# Patient Record
Sex: Female | Born: 1960 | Race: White | Hispanic: No | Marital: Married | Smoking: Never smoker
Health system: Southern US, Community
[De-identification: ages and names within clinical notes are randomized; demographics above are authoritative.]

## PROBLEM LIST (undated history)

## (undated) DIAGNOSIS — T7840XA Allergy, unspecified, initial encounter: Secondary | ICD-10-CM

## (undated) DIAGNOSIS — E079 Disorder of thyroid, unspecified: Secondary | ICD-10-CM

## (undated) DIAGNOSIS — K219 Gastro-esophageal reflux disease without esophagitis: Secondary | ICD-10-CM

## (undated) DIAGNOSIS — M109 Gout, unspecified: Secondary | ICD-10-CM

## (undated) DIAGNOSIS — I10 Essential (primary) hypertension: Secondary | ICD-10-CM

## (undated) HISTORY — DX: Gastro-esophageal reflux disease without esophagitis: K21.9

## (undated) HISTORY — DX: Essential (primary) hypertension: I10

## (undated) HISTORY — DX: Gout, unspecified: M10.9

## (undated) HISTORY — DX: Disorder of thyroid, unspecified: E07.9

## (undated) HISTORY — DX: Allergy, unspecified, initial encounter: T78.40XA

## (undated) HISTORY — PX: TONSILLECTOMY AND ADENOIDECTOMY: SHX28

---

## 2010-11-14 ENCOUNTER — Ambulatory Visit: Payer: Self-pay

## 2011-11-05 LAB — HM PAP SMEAR

## 2011-11-20 ENCOUNTER — Ambulatory Visit: Payer: Self-pay

## 2012-11-25 ENCOUNTER — Ambulatory Visit: Payer: Self-pay

## 2012-11-25 LAB — HM MAMMOGRAPHY

## 2012-12-01 ENCOUNTER — Ambulatory Visit: Payer: Self-pay

## 2013-01-12 ENCOUNTER — Ambulatory Visit: Payer: Self-pay | Admitting: Gastroenterology

## 2014-05-24 ENCOUNTER — Ambulatory Visit: Payer: Self-pay | Admitting: Surgery

## 2014-05-24 LAB — HEPATIC FUNCTION PANEL A (ARMC)
Albumin: 3.7 g/dL (ref 3.4–5.0)
Alkaline Phosphatase: 243 U/L — ABNORMAL HIGH
BILIRUBIN TOTAL: 1 mg/dL (ref 0.2–1.0)
Bilirubin, Direct: 0.2 mg/dL (ref 0.0–0.2)
SGOT(AST): 42 U/L — ABNORMAL HIGH (ref 15–37)
SGPT (ALT): 60 U/L
TOTAL PROTEIN: 8 g/dL (ref 6.4–8.2)

## 2014-05-24 LAB — POTASSIUM: POTASSIUM: 3.9 mmol/L (ref 3.5–5.1)

## 2014-05-24 LAB — HEMOGLOBIN: HGB: 14.1 g/dL (ref 12.0–16.0)

## 2014-05-25 ENCOUNTER — Ambulatory Visit: Payer: Self-pay | Admitting: Surgery

## 2014-05-25 HISTORY — PX: CHOLECYSTECTOMY: SHX55

## 2014-05-25 LAB — CREATININE, SERUM
Creatinine: 1.03 mg/dL (ref 0.60–1.30)
EGFR (African American): >60
EGFR (Non-African Amer.): 60 — ABNORMAL LOW

## 2014-09-25 ENCOUNTER — Ambulatory Visit
Admit: 2014-09-25 | Disposition: A | Discharge status: Home / Self Care | Payer: Self-pay | Attending: Gastroenterology | Admitting: Gastroenterology

## 2014-09-28 LAB — HM COLONOSCOPY

## 2014-09-30 NOTE — Op Note (Signed)
PATIENT NAME:  Kristen CallanderBROWN, Bracha D MR#:  [id number] DATE OF BIRTH:  January 24, 1961  DATE OF PROCEDURE:  05/25/2014  PREOPERATIVE DIAGNOSIS:  Acute cholecystitis.   POSTOPERATIVE DIAGNOSIS:  Acute cholecystitis.   OPERATION:  Laparoscopic cholecystectomy with cholangiography.   SURGEON:  Quentin Orealph L. Ely III, MD   ANESTHESIA:  General.   OPERATIVE PROCEDURE:  With the patient in supine position after receiving appropriate general anesthesia, the patient's abdomen was prepped with ChloraPrep and draped with sterile towels. The patient was placed in the head-down, feet-up position. A small infraumbilical incision was made in standard fashion and carried down bluntly through subcutaneous tissue. A Veress needle was used to cannulate the peritoneal cavity. CO2 was insufflated to appropriate pressure measurements. When approximately 2.5 liters of CO2 were instilled, the Veress needle was withdrawn. An 11 mm [employer] port was inserted into the peritoneal cavity. Intraperitoneal position was confirmed. CO2 was reinsufflated. The patient was placed in the head-up, feet-down position and rotated slightly to the left side. A subxiphoid transverse incision was made and 11 mm port inserted under direct vision. Two lateral ports, 5 mm in size, were inserted under direct vision. The gallbladder was grasped superiorly and laterally. It was quite distended and needed to be aspirated of approximately 15 mL of dark bile. The hepatoduodenal ligament was easily visualized. Multiple adhesions were identified. The gallbladder was clipped on the common duct side and divided.  An on-table cholangiogram using dynamic fluoroscopy revealed free flow of dye into the duodenum. No obstruction was seen, and intrahepatic radicals were visualized. The cystic duct was then doubly clipped on the common duct side and divided. The cystic artery was doubly clipped and divided. The gallbladder was then dissected free of its bed and liver using  cautery apparatus. Once the gallbladder was free, it was removed through the upper abdominal incision using the 11 mm claw grasper. There was significant bleeding off the bed of the liver because of the inflammatory change, and so a JP drain was inserted through the epigastric incision and brought out through one of the lateral stab wounds. The drain was secured with 3-0 nylon. The upper midline incision was closed with figure-of-eight suture of 0 Vicryl using the suture passer. Skin incisions were closed with 5-0 nylon. Sterile dressings were applied after the instillation of 0.25% Marcaine for postoperative pain control. The patient was returned to the recovery room having tolerated the procedure well. Sponge, instrument, and needle counts were correct x 2 in the operating room.     ____________________________ Carmie Endalph L. Ely III, MD rle:nb D: 05/25/2014 12:39:50 ET T: 05/26/2014 00:14:17 ET JOB#: [id number]  cc: Carmie Endalph L. Ely III, MD, <Dictator> Phillips Odorheryl A. Jamesetta OrleansWicker, NP Quentin OreALPH L ELY MD ELECTRONICALLY SIGNED 05/29/2014 19:52

## 2014-10-02 LAB — SURGICAL PATHOLOGY

## 2014-11-20 ENCOUNTER — Other Ambulatory Visit: Payer: Self-pay | Admitting: Unknown Physician Specialty

## 2014-11-20 ENCOUNTER — Other Ambulatory Visit: Payer: Self-pay

## 2014-11-20 DIAGNOSIS — E039 Hypothyroidism, unspecified: Secondary | ICD-10-CM

## 2014-11-20 MED ORDER — LEVOTHYROXINE SODIUM 75 MCG PO TABS: 75.0000 ug | ORAL_TABLET | Freq: Every day | ORAL | Status: DC

## 2014-11-20 NOTE — Telephone Encounter (Signed)
Needs TSH.

## 2015-03-20 ENCOUNTER — Telehealth: Payer: Self-pay | Admitting: Unknown Physician Specialty

## 2015-03-20 ENCOUNTER — Other Ambulatory Visit: Payer: Self-pay

## 2015-03-20 MED ORDER — LEVOTHYROXINE SODIUM 75 MCG PO TABS: 75.0000 ug | ORAL_TABLET | Freq: Every day | ORAL | Status: DC

## 2015-03-20 NOTE — Telephone Encounter (Signed)
Refill on synthroid to be [facility]

## 2015-03-20 NOTE — Telephone Encounter (Signed)
Patient was last seen 08/11/14, practice partner number is [id number], and [facility] [place]

## 2015-04-05 ENCOUNTER — Other Ambulatory Visit: Payer: Self-pay | Admitting: Unknown Physician Specialty

## 2015-04-05 NOTE — Telephone Encounter (Signed)
Patient has appointment on 05/08/15, practice partner number is [id number], and [facility] [place].

## 2015-04-05 NOTE — Telephone Encounter (Signed)
Pt would like a refill on synthroid sent to [facility]

## 2015-04-06 MED ORDER — LEVOTHYROXINE SODIUM 75 MCG PO TABS: 75.0000 ug | ORAL_TABLET | Freq: Every day | ORAL | Status: DC

## 2015-04-19 ENCOUNTER — Other Ambulatory Visit: Payer: Self-pay

## 2015-04-19 ENCOUNTER — Other Ambulatory Visit: Payer: Self-pay | Admitting: Unknown Physician Specialty

## 2015-04-19 MED ORDER — LISINOPRIL-HYDROCHLOROTHIAZIDE 10-12.5 MG PO TABS: 1.0000 | ORAL_TABLET | Freq: Every day | ORAL | Status: DC

## 2015-04-19 NOTE — Telephone Encounter (Signed)
Patient has appointment scheduled for 05/08/15 and [facility] [place].

## 2015-04-26 DIAGNOSIS — J309 Allergic rhinitis, unspecified: Secondary | ICD-10-CM | POA: Insufficient documentation

## 2015-04-26 DIAGNOSIS — E039 Hypothyroidism, unspecified: Secondary | ICD-10-CM | POA: Insufficient documentation

## 2015-04-26 DIAGNOSIS — I10 Essential (primary) hypertension: Secondary | ICD-10-CM | POA: Insufficient documentation

## 2015-04-26 DIAGNOSIS — K219 Gastro-esophageal reflux disease without esophagitis: Secondary | ICD-10-CM

## 2015-05-08 ENCOUNTER — Encounter: Payer: Self-pay | Admitting: Unknown Physician Specialty

## 2015-05-17 ENCOUNTER — Encounter: Payer: Self-pay | Admitting: Unknown Physician Specialty

## 2015-05-20 ENCOUNTER — Other Ambulatory Visit: Payer: Self-pay | Admitting: Unknown Physician Specialty

## 2015-05-21 NOTE — Telephone Encounter (Signed)
Needs thyroid checked.  Order in chart

## 2015-05-21 NOTE — Telephone Encounter (Signed)
Called and scheduled the patient a lab appointment.

## 2015-05-30 ENCOUNTER — Encounter: Payer: Self-pay | Admitting: Unknown Physician Specialty

## 2015-05-30 ENCOUNTER — Other Ambulatory Visit: Payer: 59

## 2015-05-30 DIAGNOSIS — E039 Hypothyroidism, unspecified: Secondary | ICD-10-CM

## 2015-05-31 LAB — TSH: TSH: 3.25 u[IU]/mL (ref 0.450–4.500)

## 2015-06-07 ENCOUNTER — Other Ambulatory Visit: Payer: Self-pay | Admitting: Unknown Physician Specialty

## 2015-06-07 MED ORDER — LEVOTHYROXINE SODIUM 75 MCG PO TABS: 75.0000 ug | ORAL_TABLET | Freq: Every day | ORAL | Status: DC

## 2015-06-18 ENCOUNTER — Other Ambulatory Visit: Payer: Self-pay | Admitting: Unknown Physician Specialty

## 2015-06-22 ENCOUNTER — Encounter: Payer: Self-pay | Admitting: Unknown Physician Specialty

## 2015-07-13 ENCOUNTER — Ambulatory Visit (INDEPENDENT_AMBULATORY_CARE_PROVIDER_SITE_OTHER): Payer: 59 | Admitting: Unknown Physician Specialty

## 2015-07-13 ENCOUNTER — Telehealth: Payer: Self-pay

## 2015-07-13 ENCOUNTER — Encounter: Payer: Self-pay | Admitting: Unknown Physician Specialty

## 2015-07-13 VITALS — BP 131/80 | HR 60 | Temp 97.9°F | Ht 62.0 in | Wt 165.0 lb

## 2015-07-13 DIAGNOSIS — E039 Hypothyroidism, unspecified: Secondary | ICD-10-CM

## 2015-07-13 DIAGNOSIS — Z23 Encounter for immunization: Secondary | ICD-10-CM | POA: Diagnosis not present

## 2015-07-13 DIAGNOSIS — I10 Essential (primary) hypertension: Secondary | ICD-10-CM

## 2015-07-13 DIAGNOSIS — Z0000 Encounter for general adult medical examination without abnormal findings: Secondary | ICD-10-CM | POA: Diagnosis not present

## 2015-07-13 LAB — MICROALBUMIN, URINE WAIVED
CREATININE, URINE WAIVED: 100 mg/dL (ref 10–300)
MICROALB, UR WAIVED: 30 mg/L — AB (ref 0–19)

## 2015-07-13 MED ORDER — FLUTICASONE PROPIONATE 50 MCG/ACT NA SUSP: 2.0000 | Freq: Every day | NASAL | Status: DC

## 2015-07-13 MED ORDER — LISINOPRIL-HYDROCHLOROTHIAZIDE 10-12.5 MG PO TABS: 1.0000 | ORAL_TABLET | Freq: Every day | ORAL | Status: DC

## 2015-07-13 MED ORDER — LEVOTHYROXINE SODIUM 75 MCG PO TABS: 75.0000 ug | ORAL_TABLET | Freq: Every day | ORAL | Status: DC

## 2015-07-13 NOTE — Patient Instructions (Signed)
Please do call to schedule your mammogram; the number to schedule one at either [facility] or [facility] is [phone]   

## 2015-07-13 NOTE — Assessment & Plan Note (Signed)
Stable, continue present medications.   

## 2015-07-13 NOTE — Progress Notes (Signed)
BP 131/80 mmHg  Pulse 60  Temp(Src) 97.9 F (36.6 C)  Ht  (1.575 m)  Wt 165 lb (74.844 kg)  BMI 30.17 kg/m2  SpO2 99%  LMP  (LMP Unknown)   Subjective:    Patient ID: Kristen Larson, female    DOB: 1961/04/30, 55 y.o.   MRN: [id number]  HPI: Kristen Larson is a 55 y.o. female  Chief Complaint  Patient presents with  . Annual Exam   Hypertension Using medications without difficulty Average home BPsNot checking  No problems or lightheadedness No chest pain with exertion or shortness of breath No Edema  Hypothyroid Energy and weight is stable   Relevant past medical, surgical, family and social history reviewed and updated as indicated. Interim medical history since our last visit reviewed. Allergies and medications reviewed and updated.  Review of Systems  Per HPI unless specifically indicated above     Objective:    BP 131/80 mmHg  Pulse 60  Temp(Src) 97.9 F (36.6 C)  Ht  (1.575 m)  Wt 165 lb (74.844 kg)  BMI 30.17 kg/m2  SpO2 99%  LMP  (LMP Unknown)  Wt Readings from Last 3 Encounters:  07/13/15 165 lb (74.844 kg)  08/11/14 159 lb (72.122 kg)    Physical Exam  Constitutional: She is oriented to person, place, and time. She appears well-developed and well-nourished.  HENT:  Head: Normocephalic and atraumatic.  Eyes: Pupils are equal, round, and reactive to light. Right eye exhibits no discharge. Left eye exhibits no discharge. No scleral icterus.  Neck: Normal range of motion. Neck supple. Carotid bruit is not present. No thyromegaly present.  Cardiovascular: Normal rate, regular rhythm and normal heart sounds.  Exam reveals no gallop and no friction rub.   No murmur heard. Pulmonary/Chest: Effort normal and breath sounds normal. No respiratory distress. She has no wheezes. She has no rales.  Abdominal: Soft. Bowel sounds are normal. There is no tenderness. There is no rebound.  Genitourinary: Vagina normal and uterus normal. No breast  swelling, tenderness or discharge. Cervix exhibits no motion tenderness, no discharge and no friability. Right adnexum displays no mass, no tenderness and no fullness. Left adnexum displays no mass, no tenderness and no fullness.  Musculoskeletal: Normal range of motion.  Lymphadenopathy:    She has no cervical adenopathy.  Neurological: She is alert and oriented to person, place, and time.  Skin: Skin is warm, dry and intact. No rash noted.  Psychiatric: She has a normal mood and affect. Her speech is normal and behavior is normal. Judgment and thought content normal. Cognition and memory are normal.    Results for orders placed or performed in visit on 07/13/15  HM COLONOSCOPY  Result Value Ref Range   HM Colonoscopy [facility]- due in 10 years       Assessment & Plan:   Problem List Items Addressed This Visit      Unprioritized   Hypertension    Stable, continue present medications.        Relevant Medications   lisinopril-hydrochlorothiazide (PRINZIDE,ZESTORETIC) 10-12.5 MG tablet   Other Relevant Orders   Comprehensive metabolic panel   Lipid Panel w/o Chol/HDL Ratio   Microalbumin, Urine Waived   Uric acid   Hypothyroidism    Euthyroid according to labs in December      Relevant Medications   levothyroxine (SYNTHROID, LEVOTHROID) 75 MCG tablet    Other Visit Diagnoses    Immunization due    -  Primary    Relevant Orders    Flu Vaccine QUAD 36+ mos IM (Completed)    Annual physical exam        Relevant Orders    CBC with Differential/Platelet    IGP, Aptima HPV, rfx 16/18,45    Hepatitis C antibody    HIV antibody    Lipid Panel w/o Chol/HDL Ratio    MM DIGITAL SCREENING BILATERAL        Follow up plan: Return in about 6 months (around 01/10/2016).

## 2015-07-13 NOTE — Telephone Encounter (Signed)
Called [facility] to find out patient's last colonoscopy. They stated it was 09/28/14 so I updated the chart and they are faxing over the report so we will have it on file.

## 2015-07-13 NOTE — Assessment & Plan Note (Addendum)
Euthyroid according to labs in December

## 2015-07-14 LAB — HEPATITIS C ANTIBODY

## 2015-07-14 LAB — CBC WITH DIFFERENTIAL/PLATELET
Basophils Absolute: 0 10*3/uL (ref 0.0–0.2)
Basos: 1 %
EOS (ABSOLUTE): 0.1 10*3/uL (ref 0.0–0.4)
Eos: 2 %
HEMATOCRIT: 42.9 % (ref 34.0–46.6)
Hemoglobin: 14.7 g/dL (ref 11.1–15.9)
IMMATURE GRANULOCYTES: 1 %
Immature Grans (Abs): 0 10*3/uL (ref 0.0–0.1)
LYMPHS ABS: 1.3 10*3/uL (ref 0.7–3.1)
Lymphs: 20 %
MCH: 29.1 pg (ref 26.6–33.0)
MCHC: 34.3 g/dL (ref 31.5–35.7)
MCV: 85 fL (ref 79–97)
MONOS ABS: 0.6 10*3/uL (ref 0.1–0.9)
Monocytes: 8 %
NEUTROS PCT: 68 %
Neutrophils Absolute: 4.6 10*3/uL (ref 1.4–7.0)
PLATELETS: 227 10*3/uL (ref 150–379)
RBC: 5.06 x10E6/uL (ref 3.77–5.28)
RDW: 13.2 % (ref 12.3–15.4)
WBC: 6.6 10*3/uL (ref 3.4–10.8)

## 2015-07-14 LAB — LIPID PANEL W/O CHOL/HDL RATIO
Cholesterol, Total: 194 mg/dL (ref 100–199)
HDL: 54 mg/dL (ref >39)
LDL Calculated: 107 mg/dL — ABNORMAL HIGH (ref 0–99)
Triglycerides: 167 mg/dL — ABNORMAL HIGH (ref 0–149)
VLDL Cholesterol Cal: 33 mg/dL (ref 5–40)

## 2015-07-14 LAB — HIV ANTIBODY (ROUTINE TESTING W REFLEX): HIV Screen 4th Generation wRfx: NONREACTIVE

## 2015-07-14 LAB — COMPREHENSIVE METABOLIC PANEL
ALK PHOS: 177 IU/L — AB (ref 39–117)
ALT: 26 IU/L (ref 0–32)
AST: 26 IU/L (ref 0–40)
Albumin/Globulin Ratio: 1.8 (ref 1.1–2.5)
Albumin: 4.5 g/dL (ref 3.5–5.5)
BILIRUBIN TOTAL: 0.6 mg/dL (ref 0.0–1.2)
BUN / CREAT RATIO: 16 (ref 9–23)
BUN: 14 mg/dL (ref 6–24)
CHLORIDE: 99 mmol/L (ref 96–106)
CO2: 28 mmol/L (ref 18–29)
Calcium: 10.2 mg/dL (ref 8.7–10.2)
Creatinine, Ser: 0.89 mg/dL (ref 0.57–1.00)
GFR calc Af Amer: 86 mL/min/{1.73_m2} (ref >59)
GFR calc non Af Amer: 74 mL/min/{1.73_m2} (ref >59)
GLOBULIN, TOTAL: 2.5 g/dL (ref 1.5–4.5)
Glucose: 77 mg/dL (ref 65–99)
Potassium: 4.1 mmol/L (ref 3.5–5.2)
SODIUM: 141 mmol/L (ref 134–144)
Total Protein: 7 g/dL (ref 6.0–8.5)

## 2015-07-14 LAB — URIC ACID: Uric Acid: 8.2 mg/dL — ABNORMAL HIGH (ref 2.5–7.1)

## 2015-07-17 ENCOUNTER — Other Ambulatory Visit: Payer: 59 | Admitting: Unknown Physician Specialty

## 2015-07-17 ENCOUNTER — Telehealth: Payer: Self-pay | Admitting: Unknown Physician Specialty

## 2015-07-17 DIAGNOSIS — R748 Abnormal levels of other serum enzymes: Secondary | ICD-10-CM

## 2015-07-17 NOTE — Telephone Encounter (Signed)
Lab call: Repeat Alkaline Phosphotase and GGT.

## 2015-07-18 ENCOUNTER — Telehealth: Payer: Self-pay

## 2015-07-18 ENCOUNTER — Encounter: Payer: Self-pay | Admitting: Unknown Physician Specialty

## 2015-07-18 DIAGNOSIS — R748 Abnormal levels of other serum enzymes: Secondary | ICD-10-CM

## 2015-07-18 LAB — IGP, APTIMA HPV, RFX 16/18,45
HPV Aptima: NEGATIVE
PAP SMEAR COMMENT: 0

## 2015-07-18 LAB — COMPREHENSIVE METABOLIC PANEL
A/G RATIO: 2 (ref 1.1–2.5)
ALT: 24 IU/L (ref 0–32)
AST: 23 IU/L (ref 0–40)
Albumin: 4.5 g/dL (ref 3.5–5.5)
Alkaline Phosphatase: 176 IU/L — ABNORMAL HIGH (ref 39–117)
BUN/Creatinine Ratio: 11 (ref 9–23)
BUN: 15 mg/dL (ref 6–24)
Bilirubin Total: 0.6 mg/dL (ref 0.0–1.2)
CALCIUM: 9.3 mg/dL (ref 8.7–10.2)
CO2: 28 mmol/L (ref 18–29)
CREATININE: 1.36 mg/dL — AB (ref 0.57–1.00)
Chloride: 101 mmol/L (ref 96–106)
GFR, EST AFRICAN AMERICAN: 51 mL/min/{1.73_m2} — AB (ref >59)
GFR, EST NON AFRICAN AMERICAN: 44 mL/min/{1.73_m2} — AB (ref >59)
GLOBULIN, TOTAL: 2.3 g/dL (ref 1.5–4.5)
Glucose: 96 mg/dL (ref 65–99)
POTASSIUM: 4.2 mmol/L (ref 3.5–5.2)
SODIUM: 143 mmol/L (ref 134–144)
TOTAL PROTEIN: 6.8 g/dL (ref 6.0–8.5)

## 2015-07-18 LAB — GAMMA GT: GGT: 166 IU/L — ABNORMAL HIGH (ref 0–60)

## 2015-07-18 NOTE — Telephone Encounter (Signed)
-----   Message from Sharol Given sent at 07/18/2015  4:14 PM EST ----- Contact: [phone] Pt returned call

## 2015-07-18 NOTE — Telephone Encounter (Signed)
I did not call this patient. Cheryl did you?  

## 2015-07-18 NOTE — Telephone Encounter (Signed)
I called and left a message.  I plan to order an abdominal US due to elevated Alk Phos and GGT.  F/u with those results.

## 2015-07-20 ENCOUNTER — Telehealth: Payer: Self-pay

## 2015-07-20 NOTE — Telephone Encounter (Signed)
Unable to reach patient. Left message.  °

## 2015-07-20 NOTE — Telephone Encounter (Signed)
Patient called and left me a voicemail saying that she was returning her call. I let patient know that cheryl had put in a order for her to have a abdominal US. Patient wants to know why here labs were elevated.

## 2015-08-01 ENCOUNTER — Ambulatory Visit
Admission: RE | Admit: 2015-08-01 | Discharge: 2015-08-01 | Disposition: A | Discharge status: Home / Self Care | Payer: 59 | Source: Ambulatory Visit | Attending: Unknown Physician Specialty | Admitting: Unknown Physician Specialty

## 2015-08-01 DIAGNOSIS — R748 Abnormal levels of other serum enzymes: Secondary | ICD-10-CM | POA: Diagnosis not present

## 2015-08-10 ENCOUNTER — Ambulatory Visit: Payer: 59

## 2015-08-15 ENCOUNTER — Ambulatory Visit
Admission: RE | Admit: 2015-08-15 | Discharge: 2015-08-15 | Disposition: A | Discharge status: Home / Self Care | Payer: 59 | Source: Ambulatory Visit | Attending: Unknown Physician Specialty | Admitting: Unknown Physician Specialty

## 2015-08-15 DIAGNOSIS — Z1231 Encounter for screening mammogram for malignant neoplasm of breast: Secondary | ICD-10-CM | POA: Diagnosis present

## 2015-08-15 DIAGNOSIS — Z0000 Encounter for general adult medical examination without abnormal findings: Secondary | ICD-10-CM

## 2015-08-20 ENCOUNTER — Other Ambulatory Visit: Payer: Self-pay | Admitting: Unknown Physician Specialty

## 2015-08-20 DIAGNOSIS — N63 Unspecified lump in unspecified breast: Secondary | ICD-10-CM

## 2015-08-21 ENCOUNTER — Other Ambulatory Visit: Payer: Self-pay | Admitting: Unknown Physician Specialty

## 2015-08-21 DIAGNOSIS — N6310 Unspecified lump in the right breast, unspecified quadrant: Secondary | ICD-10-CM

## 2015-08-23 ENCOUNTER — Ambulatory Visit
Admission: RE | Admit: 2015-08-23 | Discharge: 2015-08-23 | Disposition: A | Discharge status: Home / Self Care | Payer: 59 | Source: Ambulatory Visit | Attending: Unknown Physician Specialty | Admitting: Unknown Physician Specialty

## 2015-08-23 DIAGNOSIS — N6310 Unspecified lump in the right breast, unspecified quadrant: Secondary | ICD-10-CM

## 2015-08-23 DIAGNOSIS — N63 Unspecified lump in breast: Secondary | ICD-10-CM | POA: Diagnosis present

## 2015-09-03 ENCOUNTER — Ambulatory Visit: Payer: 59

## 2015-09-03 ENCOUNTER — Other Ambulatory Visit: Payer: 59

## 2016-01-11 ENCOUNTER — Ambulatory Visit: Payer: 59 | Admitting: Unknown Physician Specialty

## 2016-01-19 ENCOUNTER — Other Ambulatory Visit: Payer: Self-pay | Admitting: Unknown Physician Specialty

## 2016-02-22 ENCOUNTER — Encounter: Payer: Self-pay | Admitting: Unknown Physician Specialty

## 2016-02-22 ENCOUNTER — Ambulatory Visit (INDEPENDENT_AMBULATORY_CARE_PROVIDER_SITE_OTHER): Payer: 59 | Admitting: Unknown Physician Specialty

## 2016-02-22 VITALS — BP 125/78 | HR 65 | Temp 98.0°F | Ht 61.7 in | Wt 167.0 lb

## 2016-02-22 DIAGNOSIS — Z23 Encounter for immunization: Secondary | ICD-10-CM

## 2016-02-22 DIAGNOSIS — R748 Abnormal levels of other serum enzymes: Secondary | ICD-10-CM

## 2016-02-22 DIAGNOSIS — I10 Essential (primary) hypertension: Secondary | ICD-10-CM

## 2016-02-22 DIAGNOSIS — E039 Hypothyroidism, unspecified: Secondary | ICD-10-CM | POA: Diagnosis not present

## 2016-02-22 NOTE — Patient Instructions (Signed)

## 2016-02-22 NOTE — Assessment & Plan Note (Signed)
Check CMP and GGT

## 2016-02-22 NOTE — Progress Notes (Signed)
BP 125/78 (BP Location: Left Arm, Patient Position: Sitting, Cuff Size: Large)   Pulse 65   Temp 98 F (36.7 C)   Ht 5' 1.7" (1.567 m)   Wt 167 lb (75.8 kg)   LMP  (LMP Unknown)   SpO2 97%   BMI 30.84 kg/m    Subjective:    Patient ID: Kristen Larson, female    DOB: 1960-09-02, 55 y.o.   MRN: [id number]  HPI: Kristen Larson is a 55 y.o. female  Chief Complaint  Patient presents with  . Hypertension  . Hypothyroidism   No complaints today. Will re-check alk phos and GTT.  Relevant past medical, surgical, family and social history reviewed and updated as indicated. Interim medical history since our last visit reviewed. Allergies and medications reviewed and updated.  Review of Systems  Per HPI unless specifically indicated above     Objective:    BP 125/78 (BP Location: Left Arm, Patient Position: Sitting, Cuff Size: Large)   Pulse 65   Temp 98 F (36.7 C)   Ht 5' 1.7" (1.567 m)   Wt 167 lb (75.8 kg)   LMP  (LMP Unknown)   SpO2 97%   BMI 30.84 kg/m   Wt Readings from Last 3 Encounters:  02/22/16 167 lb (75.8 kg)  07/13/15 165 lb (74.8 kg)  08/11/14 159 lb (72.1 kg)    Physical Exam  Constitutional: She is oriented to person, place, and time. She appears well-developed and well-nourished.  HENT:  Head: Normocephalic and atraumatic.  Cardiovascular: Normal rate, regular rhythm and normal heart sounds.   Pulmonary/Chest: Breath sounds normal. No respiratory distress.  Neurological: She is alert and oriented to person, place, and time.  Skin: Skin is warm and dry.  Psychiatric: She has a normal mood and affect. Her behavior is normal. Judgment and thought content normal.    Results for orders placed or performed in visit on 07/17/15  Comprehensive metabolic panel  Result Value Ref Range   Glucose 96 65 - 99 mg/dL   BUN 15 6 - 24 mg/dL   Creatinine, Ser 1.36 (H) 0.57 - 1.00 mg/dL   GFR calc non Af Amer 44 (L) >59 mL/min/1.73   GFR calc Af Amer 51 (L)  >59 mL/min/1.73   BUN/Creatinine Ratio 11 9 - 23   Sodium 143 134 - 144 mmol/L   Potassium 4.2 3.5 - 5.2 mmol/L   Chloride 101 96 - 106 mmol/L   CO2 28 18 - 29 mmol/L   Calcium 9.3 8.7 - 10.2 mg/dL   Total Protein 6.8 6.0 - 8.5 g/dL   Albumin 4.5 3.5 - 5.5 g/dL   Globulin, Total 2.3 1.5 - 4.5 g/dL   Albumin/Globulin Ratio 2.0 1.1 - 2.5   Bilirubin Total 0.6 0.0 - 1.2 mg/dL   Alkaline Phosphatase 176 (H) 39 - 117 IU/L   AST 23 0 - 40 IU/L   ALT 24 0 - 32 IU/L  Gamma GT  Result Value Ref Range   GGT 166 (H) 0 - 60 IU/L      Assessment & Plan:   Problem List Items Addressed This Visit      Cardiovascular and Mediastinum   Hypertension    Stable, continue present medications        Endocrine   Hypothyroidism     Other   Elevated alkaline phosphatase level    Check CMP and GGT      Relevant Orders   Comprehensive metabolic panel  Gamma GT    Other Visit Diagnoses    Immunization due    -  Primary   Relevant Orders   Flu Vaccine QUAD 36+ mos IM (Completed)       Follow up plan: Will contact regarding labwork next week. Return for physical in 6 months.

## 2016-02-22 NOTE — Assessment & Plan Note (Signed)
Stable, continue present medications.   

## 2016-02-23 LAB — COMPREHENSIVE METABOLIC PANEL
ALBUMIN: 4.5 g/dL (ref 3.5–5.5)
ALT: 18 IU/L (ref 0–32)
AST: 21 IU/L (ref 0–40)
Albumin/Globulin Ratio: 1.8 (ref 1.2–2.2)
Alkaline Phosphatase: 164 IU/L — ABNORMAL HIGH (ref 39–117)
BILIRUBIN TOTAL: 0.8 mg/dL (ref 0.0–1.2)
BUN / CREAT RATIO: 16 (ref 9–23)
BUN: 15 mg/dL (ref 6–24)
CHLORIDE: 99 mmol/L (ref 96–106)
CO2: 26 mmol/L (ref 18–29)
Calcium: 9.8 mg/dL (ref 8.7–10.2)
Creatinine, Ser: 0.92 mg/dL (ref 0.57–1.00)
GFR calc Af Amer: 82 mL/min/{1.73_m2} (ref >59)
GFR calc non Af Amer: 71 mL/min/{1.73_m2} (ref >59)
GLOBULIN, TOTAL: 2.5 g/dL (ref 1.5–4.5)
Glucose: 76 mg/dL (ref 65–99)
POTASSIUM: 4.1 mmol/L (ref 3.5–5.2)
SODIUM: 140 mmol/L (ref 134–144)
Total Protein: 7 g/dL (ref 6.0–8.5)

## 2016-02-23 LAB — GAMMA GT: GGT: 117 IU/L — AB (ref 0–60)

## 2016-02-26 ENCOUNTER — Encounter: Payer: Self-pay | Admitting: Unknown Physician Specialty

## 2016-06-24 ENCOUNTER — Other Ambulatory Visit: Payer: Self-pay | Admitting: Unknown Physician Specialty

## 2016-07-16 ENCOUNTER — Other Ambulatory Visit: Payer: Self-pay | Admitting: Unknown Physician Specialty

## 2016-07-16 DIAGNOSIS — Z1231 Encounter for screening mammogram for malignant neoplasm of breast: Secondary | ICD-10-CM

## 2016-07-18 ENCOUNTER — Encounter: Payer: 59 | Admitting: Unknown Physician Specialty

## 2016-07-22 ENCOUNTER — Other Ambulatory Visit: Payer: Self-pay | Admitting: Unknown Physician Specialty

## 2016-08-21 ENCOUNTER — Ambulatory Visit
Admission: RE | Admit: 2016-08-21 | Discharge: 2016-08-21 | Disposition: A | Discharge status: Home / Self Care | Payer: 59 | Source: Ambulatory Visit | Attending: Unknown Physician Specialty | Admitting: Unknown Physician Specialty

## 2016-08-21 DIAGNOSIS — Z1231 Encounter for screening mammogram for malignant neoplasm of breast: Secondary | ICD-10-CM | POA: Diagnosis not present

## 2016-10-24 ENCOUNTER — Telehealth: Payer: Self-pay

## 2016-10-24 NOTE — Telephone Encounter (Signed)
-----   Message from Dorcas CarrowMegan P Johnson, DO sent at 10/24/2016  9:37 AM EDT ----- Can you please make sure that she gets her mammogram done please? Thanks! ----- Message ----- From: SYSTEM Sent: 10/24/2016  12:06 AM To: Gabriel Cirriheryl Wicker, NP

## 2016-10-24 NOTE — Telephone Encounter (Signed)
Pt needs to call [phone] to schedule mammogram. No vm set up on [employer] number. Message left on pt's home number for her to return our call.

## 2016-10-27 NOTE — Telephone Encounter (Signed)
Left message on machine for pt to return call to the office.  

## 2016-10-27 NOTE — Telephone Encounter (Signed)
Pt stated she had her mammogram done around March or April at [facility].

## 2016-10-27 NOTE — Telephone Encounter (Signed)
Pt called back and was given message to schedule mammogram by Christan

## 2017-01-21 ENCOUNTER — Other Ambulatory Visit: Payer: Self-pay | Admitting: Unknown Physician Specialty

## 2017-04-21 ENCOUNTER — Other Ambulatory Visit: Payer: Self-pay | Admitting: Unknown Physician Specialty

## 2017-04-22 ENCOUNTER — Ambulatory Visit (INDEPENDENT_AMBULATORY_CARE_PROVIDER_SITE_OTHER): Payer: 59 | Admitting: Unknown Physician Specialty

## 2017-04-22 ENCOUNTER — Encounter: Payer: Self-pay | Admitting: Unknown Physician Specialty

## 2017-04-22 VITALS — BP 105/66 | HR 71 | Temp 97.9°F | Ht 62.3 in | Wt 168.0 lb

## 2017-04-22 DIAGNOSIS — Z23 Encounter for immunization: Secondary | ICD-10-CM | POA: Diagnosis not present

## 2017-04-22 DIAGNOSIS — E039 Hypothyroidism, unspecified: Secondary | ICD-10-CM | POA: Diagnosis not present

## 2017-04-22 DIAGNOSIS — I10 Essential (primary) hypertension: Secondary | ICD-10-CM | POA: Diagnosis not present

## 2017-04-22 DIAGNOSIS — R748 Abnormal levels of other serum enzymes: Secondary | ICD-10-CM

## 2017-04-22 MED ORDER — FLUTICASONE PROPIONATE 50 MCG/ACT NA SUSP: 2.0000 | Freq: Every day | NASAL | 11 refills | Status: DC

## 2017-04-22 MED ORDER — LEVOTHYROXINE SODIUM 75 MCG PO TABS: 75.0000 ug | ORAL_TABLET | Freq: Every day | ORAL | 0 refills | Status: DC

## 2017-04-22 MED ORDER — LISINOPRIL-HYDROCHLOROTHIAZIDE 10-12.5 MG PO TABS: 1.0000 | ORAL_TABLET | Freq: Every day | ORAL | 1 refills | Status: DC

## 2017-04-22 NOTE — Assessment & Plan Note (Signed)
Stable, continue present medications.   

## 2017-04-22 NOTE — Assessment & Plan Note (Addendum)
Check TSH.  Adjust medications as needed

## 2017-04-22 NOTE — Progress Notes (Signed)
BP 105/66   Pulse 71   Temp 97.9 F (36.6 C) (Oral)   Ht 5' 2.3" (1.582 m)   Wt 168 lb (76.2 kg)   LMP  (LMP Unknown)   SpO2 97%   BMI 30.43 kg/m    Subjective:    Patient ID: Kristen Larson, female    DOB: Feb 21, 1961, 56 y.o.   MRN: [id number]  HPI: Kristen Larson is a 56 y.o. female  Chief Complaint  Patient presents with  . Hypertension  . Hypothyroidism   Hypertension Using medications without difficulty Average home BPs Not chekcing  No problems or lightheadedness No chest pain with exertion or shortness of breath No Edema  Hypothyroid Due to check a TSH.  No changes in weight our energy  Elevated Alk phos/GGT Needs recheck of these.  Will also check ALT/AST  Relevant past medical, surgical, family and social history reviewed and updated as indicated. Interim medical history since our last visit reviewed. Allergies and medications reviewed and updated.  Review of Systems  Constitutional: Negative.   HENT: Negative.   Eyes: Negative.   Respiratory: Negative.   Cardiovascular: Negative.   Gastrointestinal: Negative.   Endocrine: Negative.   Genitourinary: Negative.   Musculoskeletal: Negative.   Skin: Negative.   Allergic/Immunologic: Negative.   Neurological: Negative.   Hematological: Negative.   Psychiatric/Behavioral: Negative.     Per HPI unless specifically indicated above     Objective:    BP 105/66   Pulse 71   Temp 97.9 F (36.6 C) (Oral)   Ht 5' 2.3" (1.582 m)   Wt 168 lb (76.2 kg)   LMP  (LMP Unknown)   SpO2 97%   BMI 30.43 kg/m   Wt Readings from Last 3 Encounters:  04/22/17 168 lb (76.2 kg)  02/22/16 167 lb (75.8 kg)  07/13/15 165 lb (74.8 kg)    Physical Exam  Constitutional: She is oriented to person, place, and time. She appears well-developed and well-nourished. No distress.  HENT:  Head: Normocephalic and atraumatic.  Eyes: Conjunctivae and lids are normal. Right eye exhibits no discharge. Left eye exhibits no  discharge. No scleral icterus.  Neck: Normal range of motion. Neck supple. No JVD present. Carotid bruit is not present.  Cardiovascular: Normal rate, regular rhythm and normal heart sounds.  Pulmonary/Chest: Effort normal and breath sounds normal.  Abdominal: Normal appearance. There is no splenomegaly or hepatomegaly.  Musculoskeletal: Normal range of motion.  Neurological: She is alert and oriented to person, place, and time.  Skin: Skin is warm, dry and intact. No rash noted. No pallor.  Psychiatric: She has a normal mood and affect. Her behavior is normal. Judgment and thought content normal.    Results for orders placed or performed in visit on 02/22/16  Comprehensive metabolic panel  Result Value Ref Range   Glucose 76 65 - 99 mg/dL   BUN 15 6 - 24 mg/dL   Creatinine, Ser 0.92 0.57 - 1.00 mg/dL   GFR calc non Af Amer 71 >59 mL/min/1.73   GFR calc Af Amer 82 >59 mL/min/1.73   BUN/Creatinine Ratio 16 9 - 23   Sodium 140 134 - 144 mmol/L   Potassium 4.1 3.5 - 5.2 mmol/L   Chloride 99 96 - 106 mmol/L   CO2 26 18 - 29 mmol/L   Calcium 9.8 8.7 - 10.2 mg/dL   Total Protein 7.0 6.0 - 8.5 g/dL   Albumin 4.5 3.5 - 5.5 g/dL   Globulin, Total 2.5 1.5 -  4.5 g/dL   Albumin/Globulin Ratio 1.8 1.2 - 2.2   Bilirubin Total 0.8 0.0 - 1.2 mg/dL   Alkaline Phosphatase 164 (H) 39 - 117 IU/L   AST 21 0 - 40 IU/L   ALT 18 0 - 32 IU/L  Gamma GT  Result Value Ref Range   GGT 117 (H) 0 - 60 IU/L      Assessment & Plan:   Problem List Items Addressed This Visit      Unprioritized   Elevated alkaline phosphatase level    Recheck Alk phos/GGT today      Relevant Orders   Gamma GT   Hypertension    Stable, continue present medications.        Relevant Medications   lisinopril-hydrochlorothiazide (PRINZIDE,ZESTORETIC) 10-12.5 MG tablet   Other Relevant Orders   Comprehensive metabolic panel   Hypothyroidism    Check TSH.  Adjust medications as needed      Relevant Medications    levothyroxine (SYNTHROID, LEVOTHROID) 75 MCG tablet   Other Relevant Orders   TSH    Other Visit Diagnoses    Need for immunization against influenza    -  Primary   Relevant Orders   Flu Vaccine QUAD 36+ mos IM (Completed)    .   Follow up plan: Return in about 6 months (around 10/20/2017) for physical.

## 2017-04-22 NOTE — Assessment & Plan Note (Signed)
Recheck Alk phos/GGT today

## 2017-04-22 NOTE — Patient Instructions (Addendum)

## 2017-04-23 LAB — COMPREHENSIVE METABOLIC PANEL
ALK PHOS: 186 IU/L — AB (ref 39–117)
ALT: 40 IU/L — ABNORMAL HIGH (ref 0–32)
AST: 33 IU/L (ref 0–40)
Albumin/Globulin Ratio: 1.8 (ref 1.2–2.2)
Albumin: 4.4 g/dL (ref 3.5–5.5)
BUN/Creatinine Ratio: 12 (ref 9–23)
BUN: 12 mg/dL (ref 6–24)
Bilirubin Total: 0.6 mg/dL (ref 0.0–1.2)
CALCIUM: 9.6 mg/dL (ref 8.7–10.2)
CO2: 27 mmol/L (ref 20–29)
CREATININE: 1.01 mg/dL — AB (ref 0.57–1.00)
Chloride: 101 mmol/L (ref 96–106)
GFR calc Af Amer: 72 mL/min/{1.73_m2} (ref >59)
GFR, EST NON AFRICAN AMERICAN: 63 mL/min/{1.73_m2} (ref >59)
GLOBULIN, TOTAL: 2.5 g/dL (ref 1.5–4.5)
GLUCOSE: 89 mg/dL (ref 65–99)
Potassium: 4.2 mmol/L (ref 3.5–5.2)
Sodium: 143 mmol/L (ref 134–144)
Total Protein: 6.9 g/dL (ref 6.0–8.5)

## 2017-04-23 LAB — TSH: TSH: 3.65 u[IU]/mL (ref 0.450–4.500)

## 2017-04-23 LAB — GAMMA GT: GGT: 194 IU/L — AB (ref 0–60)

## 2017-05-27 DIAGNOSIS — Z85828 Personal history of other malignant neoplasm of skin: Secondary | ICD-10-CM | POA: Diagnosis not present

## 2017-06-04 ENCOUNTER — Other Ambulatory Visit: Payer: Self-pay | Admitting: Unknown Physician Specialty

## 2017-06-04 NOTE — Telephone Encounter (Signed)
[facility] [address]

## 2017-07-31 ENCOUNTER — Other Ambulatory Visit: Payer: Self-pay | Admitting: Unknown Physician Specialty

## 2017-08-06 ENCOUNTER — Other Ambulatory Visit: Payer: Self-pay | Admitting: Unknown Physician Specialty

## 2017-10-21 ENCOUNTER — Other Ambulatory Visit: Payer: Self-pay | Admitting: Unknown Physician Specialty

## 2017-10-21 DIAGNOSIS — Z1231 Encounter for screening mammogram for malignant neoplasm of breast: Secondary | ICD-10-CM

## 2017-10-23 ENCOUNTER — Ambulatory Visit: Payer: 59 | Admitting: Unknown Physician Specialty

## 2017-11-24 ENCOUNTER — Ambulatory Visit
Admission: RE | Admit: 2017-11-24 | Discharge: 2017-11-24 | Disposition: A | Discharge status: Home / Self Care | Payer: 59 | Source: Ambulatory Visit | Attending: Unknown Physician Specialty | Admitting: Unknown Physician Specialty

## 2017-11-24 DIAGNOSIS — Z1231 Encounter for screening mammogram for malignant neoplasm of breast: Secondary | ICD-10-CM | POA: Diagnosis not present

## 2017-12-23 ENCOUNTER — Encounter: Payer: Self-pay | Admitting: Unknown Physician Specialty

## 2018-01-18 ENCOUNTER — Encounter: Payer: Self-pay | Admitting: Family Medicine

## 2018-01-18 ENCOUNTER — Ambulatory Visit (INDEPENDENT_AMBULATORY_CARE_PROVIDER_SITE_OTHER): Payer: 59 | Admitting: Family Medicine

## 2018-01-18 ENCOUNTER — Ambulatory Visit: Payer: Self-pay | Admitting: *Deleted

## 2018-01-18 VITALS — BP 133/84 | HR 75 | Temp 98.7°F | Ht 62.0 in | Wt 164.7 lb

## 2018-01-18 DIAGNOSIS — M79675 Pain in left toe(s): Secondary | ICD-10-CM | POA: Diagnosis not present

## 2018-01-18 MED ORDER — PREDNISONE 10 MG PO TABS: ORAL_TABLET | ORAL | 0 refills | Status: DC

## 2018-01-18 NOTE — Telephone Encounter (Signed)
Patient called with questions regarding today's dose of prednisone. Answered question, advice care reviewed on gout inflammation.  Patient very appreciative of information provided.   Reason for Disposition . Caller has medication question only, adult not sick, and triager answers question  Answer Assessment - Initial Assessment Questions 1. SYMPTOMS: "Do you have any symptoms?"  Not applicable No triage done.     no 2. SEVERITY: If symptoms are present, ask "Are they mild, moderate or severe?"    no  Protocols used: MEDICATION QUESTION CALL-A-AH

## 2018-01-18 NOTE — Patient Instructions (Signed)
Follow up as needed

## 2018-01-18 NOTE — Progress Notes (Signed)
BP 133/84   Pulse 75   Temp 98.7 F (37.1 C) (Oral)   Ht 5\' 2"  (1.575 m)   Wt 164 lb 11.2 oz (74.7 kg)   LMP  (LMP Unknown)   SpO2 98%   BMI 30.12 kg/m    Subjective:    Patient ID: Kristen Larson, female    DOB: [birth year], 57 y.o.   MRN: [id number]  HPI: Kristen CallanderColette D Dunklee is a 57 y.o. female  Chief Complaint  Patient presents with  . Foot Pain    left foot since Saturday. Pt states her big toe felt like it was cramping then started turning red and hot to the touch   2 days of left great toe pain, redness, swelling, and heat. Denies injury, fevers, chills, sweats. Trying some ice prn with some relief. Has never had something like this happen to her before.   Relevant past medical, surgical, family and social history reviewed and updated as indicated. Interim medical history since our last visit reviewed. Allergies and medications reviewed and updated.  Review of Systems  Per HPI unless specifically indicated above     Objective:    BP 133/84   Pulse 75   Temp 98.7 F (37.1 C) (Oral)   Ht 5\' 2"  (1.575 m)   Wt 164 lb 11.2 oz (74.7 kg)   LMP  (LMP Unknown)   SpO2 98%   BMI 30.12 kg/m   Wt Readings from Last 3 Encounters:  01/18/18 164 lb 11.2 oz (74.7 kg)  04/22/17 168 lb (76.2 kg)  02/22/16 167 lb (75.8 kg)    Physical Exam  Constitutional: She is oriented to person, place, and time. She appears well-developed and well-nourished. No distress.  HENT:  Head: Atraumatic.  Eyes: Conjunctivae and EOM are normal.  Neck: Normal range of motion. Neck supple.  Cardiovascular: Normal rate and regular rhythm.  Pulmonary/Chest: Effort normal and breath sounds normal.  Musculoskeletal: She exhibits edema and tenderness.  Left great toe erythematous, edematous, warm, ttp  Neurological: She is alert and oriented to person, place, and time.  Skin: Skin is warm and dry. There is erythema.  Psychiatric: She has a normal mood and affect. Her behavior is normal.  Nursing  note and vitals reviewed.  Results for orders placed or performed in visit on 04/22/17  Comprehensive metabolic panel  Result Value Ref Range   Glucose 89 65 - 99 mg/dL   BUN 12 6 - 24 mg/dL   Creatinine, Ser 7.821.01 (H) 0.57 - 1.00 mg/dL   GFR calc non Af Amer 63 >59 mL/min/1.73   GFR calc Af Amer 72 >59 mL/min/1.73   BUN/Creatinine Ratio 12 9 - 23   Sodium 143 134 - 144 mmol/L   Potassium 4.2 3.5 - 5.2 mmol/L   Chloride 101 96 - 106 mmol/L   CO2 27 20 - 29 mmol/L   Calcium 9.6 8.7 - 10.2 mg/dL   Total Protein 6.9 6.0 - 8.5 g/dL   Albumin 4.4 3.5 - 5.5 g/dL   Globulin, Total 2.5 1.5 - 4.5 g/dL   Albumin/Globulin Ratio 1.8 1.2 - 2.2   Bilirubin Total 0.6 0.0 - 1.2 mg/dL   Alkaline Phosphatase 186 (H) 39 - 117 IU/L   AST 33 0 - 40 IU/L   ALT 40 (H) 0 - 32 IU/L  TSH  Result Value Ref Range   TSH 3.650 0.450 - 4.500 uIU/mL  Gamma GT  Result Value Ref Range   GGT 194 (H) 0 -  60 IU/L      Assessment & Plan:   Problem List Items Addressed This Visit    None    Visit Diagnoses    Pain of toe of left foot    -  Primary   Suspect gout, will draw uric acid level and tx with prednisone and naproxen. Watch for fevers, sores, drainage in case early cellulitis. F/u if no better   Relevant Orders   Uric acid   Basic Metabolic Panel (BMET)       Follow up plan: Return if symptoms worsen or fail to improve.

## 2018-01-19 ENCOUNTER — Telehealth: Payer: Self-pay | Admitting: Family Medicine

## 2018-01-19 LAB — BASIC METABOLIC PANEL
BUN/Creatinine Ratio: 15 (ref 9–23)
BUN: 14 mg/dL (ref 6–24)
CALCIUM: 10.2 mg/dL (ref 8.7–10.2)
CHLORIDE: 97 mmol/L (ref 96–106)
CO2: 24 mmol/L (ref 20–29)
Creatinine, Ser: 0.91 mg/dL (ref 0.57–1.00)
GFR calc Af Amer: 82 mL/min/{1.73_m2} (ref >59)
GFR calc non Af Amer: 71 mL/min/{1.73_m2} (ref >59)
GLUCOSE: 86 mg/dL (ref 65–99)
POTASSIUM: 4 mmol/L (ref 3.5–5.2)
Sodium: 138 mmol/L (ref 134–144)

## 2018-01-19 LAB — URIC ACID: URIC ACID: 8.3 mg/dL — AB (ref 2.5–7.1)

## 2018-01-19 NOTE — Telephone Encounter (Signed)
Patient notified and verbalized understanding. 

## 2018-01-19 NOTE — Telephone Encounter (Signed)
Pts husband calling and states to call wife on her work number.

## 2018-01-19 NOTE — Telephone Encounter (Signed)
Called pt regarding lab results, could not leave message. Elevated uric acid level indicating that her current foot pain is a gout flare. Should clear up with what she's taking, but if she keeps having flares we may need to change her blood pressure medication or start gout medication. She should follow up if not improving

## 2018-01-22 ENCOUNTER — Encounter: Payer: 59 | Admitting: Unknown Physician Specialty

## 2018-01-26 ENCOUNTER — Other Ambulatory Visit: Payer: Self-pay

## 2018-01-26 ENCOUNTER — Encounter: Payer: Self-pay | Admitting: Physician Assistant

## 2018-01-26 ENCOUNTER — Ambulatory Visit (INDEPENDENT_AMBULATORY_CARE_PROVIDER_SITE_OTHER): Payer: 59 | Admitting: Physician Assistant

## 2018-01-26 VITALS — BP 108/70 | HR 62 | Temp 98.0°F | Ht 61.5 in | Wt 163.0 lb

## 2018-01-26 DIAGNOSIS — Z0000 Encounter for general adult medical examination without abnormal findings: Secondary | ICD-10-CM

## 2018-01-26 DIAGNOSIS — E039 Hypothyroidism, unspecified: Secondary | ICD-10-CM

## 2018-01-26 DIAGNOSIS — Z23 Encounter for immunization: Secondary | ICD-10-CM

## 2018-01-26 DIAGNOSIS — I10 Essential (primary) hypertension: Secondary | ICD-10-CM

## 2018-01-26 MED ORDER — LISINOPRIL 10 MG PO TABS: 10.0000 mg | ORAL_TABLET | Freq: Every day | ORAL | 3 refills | Status: DC

## 2018-01-26 MED ORDER — LEVOTHYROXINE SODIUM 75 MCG PO TABS: 75.0000 ug | ORAL_TABLET | Freq: Every day | ORAL | 3 refills | Status: DC

## 2018-01-26 NOTE — Progress Notes (Signed)
Subjective:    Patient ID: Kristen Larson, female    DOB: 12/05/1960, 57 y.o.   MRN: [id number]  Kristen Larson is a 57 y.o. female presenting on 01/26/2018 for Annual Exam   HPI   Living in [place], works at [employer] in [employer]. Lives with husband of 30 years. No children.   Mammogram: 11/2017 normal, family history of breast cancer in grandmother  PAP: 07/2015 normal with no HPV Colonoscopy: 2016 at [facility], normal repeat in 10 years Due for flu vaccine and Tdap vaccine   Had episode of gout recently that was resolved with prednisone. She is on Lisinopril-HCTZ for blood pressure and wants to know if diuretic portion causes gout.  BP Readings from Last 3 Encounters:  01/26/18 108/70  01/18/18 133/84  04/22/17 105/66     BP Readings from Last 3 Encounters:  01/26/18 108/70  01/18/18 133/84  04/22/17 105/66    Past Medical History:  Diagnosis Date  . Allergy   . GERD (gastroesophageal reflux disease)   . Hypertension   . Thyroid disease    Past Surgical History:  Procedure Laterality Date  . CHOLECYSTECTOMY  05/25/14  . TONSILLECTOMY AND ADENOIDECTOMY     Social History   Socioeconomic History  . Marital status: Married    Spouse name: Not on file  . Number of children: Not on file  . Years of education: Not on file  . Highest education level: Not on file  Occupational History  . Not on file  Social Needs  . Financial resource strain: Not on file  . Food insecurity:    Worry: Not on file    Inability: Not on file  . Transportation needs:    Medical: Not on file    Non-medical: Not on file  Tobacco Use  . Smoking status: Never Smoker  . Smokeless tobacco: Never Used  Substance and Sexual Activity  . Alcohol use: No    Alcohol/week: 0.0 standard drinks  . Drug use: No  . Sexual activity: Yes  Lifestyle  . Physical activity:    Days per week: Not on file    Minutes per session: Not on file  . Stress: Not on file  Relationships  . Social  connections:    Talks on phone: Not on file    Gets together: Not on file    Attends religious service: Not on file    Active member of club or organization: Not on file    Attends meetings of clubs or organizations: Not on file    Relationship status: Not on file  . Intimate partner violence:    Fear of current or ex partner: Not on file    Emotionally abused: Not on file    Physically abused: Not on file    Forced sexual activity: Not on file  Other Topics Concern  . Not on file  Social History Narrative  . Not on file   Family History  Problem Relation Age of Onset  . Heart disease Mother   . Thyroid disease Mother   . COPD Father   . Stroke Father   . Glaucoma Father   . Thyroid disease Sister   . Crohn's disease Brother   . Alzheimer's disease Maternal Grandmother   . Breast cancer Maternal Grandmother 36  . Heart disease Maternal Grandfather   . Lupus Paternal Grandmother   . Diabetes Paternal Grandfather   . Down syndrome Brother    Current Outpatient Medications on File Prior  to Visit  Medication Sig  . aspirin 81 MG tablet Take 81 mg by mouth daily.  . fluticasone (FLONASE) 50 MCG/ACT nasal spray PLACE 2 SPRAYS INTO BOTH NOSTRILS DAILY.  . Multiple Vitamin (MULTIVITAMIN) tablet Take 1 tablet by mouth daily.  . predniSONE (DELTASONE) 10 MG tablet Take 6 tabs day one, 5 tabs day two, 4 tabs day three, etc   No current facility-administered medications on file prior to visit.     Review of Systems Per HPI unless specifically indicated above     Objective:    BP 108/70   Pulse 62   Temp 98 F (36.7 C) (Oral)   Ht 5' 1.5" (1.562 m)   Wt 163 lb (73.9 kg)   LMP  (LMP Unknown)   SpO2 98%   BMI 30.30 kg/m   Wt Readings from Last 3 Encounters:  01/26/18 163 lb (73.9 kg)  01/18/18 164 lb 11.2 oz (74.7 kg)  04/22/17 168 lb (76.2 kg)    Physical Exam  Constitutional: She is oriented to person, place, and time. She appears well-developed and well-nourished.   HENT:  Right Ear: External ear normal.  Left Ear: External ear normal.  Mouth/Throat: Oropharynx is clear and moist.  Eyes: Conjunctivae are normal.  Neck: Neck supple.  Cardiovascular: Normal rate and regular rhythm.  Pulmonary/Chest: Effort normal and breath sounds normal.  Breast exam declined.   Abdominal: Soft. Bowel sounds are normal.  Lymphadenopathy:    She has no cervical adenopathy.  Neurological: She is alert and oriented to person, place, and time.  Skin: Skin is warm and dry.  Psychiatric: She has a normal mood and affect. Her behavior is normal.   Results for orders placed or performed in visit on 01/18/18  Uric acid  Result Value Ref Range   Uric Acid 8.3 (H) 2.5 - 7.1 mg/dL  Basic Metabolic Panel (BMET)  Result Value Ref Range   Glucose 86 65 - 99 mg/dL   BUN 14 6 - 24 mg/dL   Creatinine, Ser 0.91 0.57 - 1.00 mg/dL   GFR calc non Af Amer 71 >59 mL/min/1.73   GFR calc Af Amer 82 >59 mL/min/1.73   BUN/Creatinine Ratio 15 9 - 23   Sodium 138 134 - 144 mmol/L   Potassium 4.0 3.5 - 5.2 mmol/L   Chloride 97 96 - 106 mmol/L   CO2 24 20 - 29 mmol/L   Calcium 10.2 8.7 - 10.2 mg/dL      Assessment & Plan:  1. Annual physical exam  - TSH - Lipid Profile - CBC with Differential - Comp Met (CMET)  2. Flu vaccine need  - Flu Vaccine QUAD 36+ mos IM  3. Need for diphtheria-tetanus-pertussis (Tdap) vaccine  - Tdap vaccine greater than or equal to 7yo IM  4. Essential hypertension  Can discontinue HCTZ due to recent episode of gout. F/u 3 mo to check BP, it is low normal today and she can probably get by on 10 mg Lisinopril. If she needs more BP control, Lisinopril can be increased. If gout recurs, may need chronic gout therapy as uric acid level was 8.3.  - lisinopril (PRINIVIL,ZESTRIL) 10 MG tablet; Take 1 tablet (10 mg total) by mouth daily.  Dispense: 90 tablet; Refill: 3  5. Hypothyroidism, unspecified type  - levothyroxine (SYNTHROID, LEVOTHROID) 75  MCG tablet; Take 1 tablet (75 mcg total) by mouth daily before breakfast.  Dispense: 90 tablet; Refill: 3   Follow up plan: Return in about 3 months (  around 04/28/2018) for blood pressure .  Carles Collet, PA-C [facility] 01/26/2018, 4:56 PM

## 2018-01-27 ENCOUNTER — Other Ambulatory Visit: Payer: Self-pay | Admitting: Unknown Physician Specialty

## 2018-01-27 LAB — COMPREHENSIVE METABOLIC PANEL
ALT: 22 IU/L (ref 0–32)
AST: 15 IU/L (ref 0–40)
Albumin/Globulin Ratio: 1.7 (ref 1.2–2.2)
Albumin: 4.3 g/dL (ref 3.5–5.5)
Alkaline Phosphatase: 132 IU/L — ABNORMAL HIGH (ref 39–117)
BUN/Creatinine Ratio: 15 (ref 9–23)
BUN: 16 mg/dL (ref 6–24)
Bilirubin Total: 0.8 mg/dL (ref 0.0–1.2)
CO2: 27 mmol/L (ref 20–29)
Calcium: 9.6 mg/dL (ref 8.7–10.2)
Chloride: 95 mmol/L — ABNORMAL LOW (ref 96–106)
Creatinine, Ser: 1.05 mg/dL — ABNORMAL HIGH (ref 0.57–1.00)
GFR calc Af Amer: 69 mL/min/{1.73_m2} (ref >59)
GFR calc non Af Amer: 60 mL/min/{1.73_m2} (ref >59)
Globulin, Total: 2.5 g/dL (ref 1.5–4.5)
Glucose: 82 mg/dL (ref 65–99)
Potassium: 4.2 mmol/L (ref 3.5–5.2)
Sodium: 139 mmol/L (ref 134–144)
Total Protein: 6.8 g/dL (ref 6.0–8.5)

## 2018-01-27 LAB — CBC WITH DIFFERENTIAL/PLATELET
Basophils Absolute: 0 10*3/uL (ref 0.0–0.2)
Basos: 0 %
EOS (ABSOLUTE): 0.2 10*3/uL (ref 0.0–0.4)
Eos: 1 %
Hematocrit: 46.9 % — ABNORMAL HIGH (ref 34.0–46.6)
Hemoglobin: 15.5 g/dL (ref 11.1–15.9)
Immature Grans (Abs): 0.2 10*3/uL — ABNORMAL HIGH (ref 0.0–0.1)
Immature Granulocytes: 2 %
Lymphocytes Absolute: 2 10*3/uL (ref 0.7–3.1)
Lymphs: 18 %
MCH: 29 pg (ref 26.6–33.0)
MCHC: 33 g/dL (ref 31.5–35.7)
MCV: 88 fL (ref 79–97)
Monocytes Absolute: 0.6 10*3/uL (ref 0.1–0.9)
Monocytes: 6 %
Neutrophils Absolute: 7.8 10*3/uL — ABNORMAL HIGH (ref 1.4–7.0)
Neutrophils: 73 %
Platelets: 267 10*3/uL (ref 150–450)
RBC: 5.34 x10E6/uL — ABNORMAL HIGH (ref 3.77–5.28)
RDW: 13.1 % (ref 12.3–15.4)
WBC: 10.6 10*3/uL (ref 3.4–10.8)

## 2018-01-27 LAB — TSH: TSH: 4.22 u[IU]/mL (ref 0.450–4.500)

## 2018-01-27 LAB — LIPID PANEL
Chol/HDL Ratio: 3.9 ratio (ref 0.0–4.4)
Cholesterol, Total: 177 mg/dL (ref 100–199)
HDL: 45 mg/dL (ref >39)
LDL Calculated: 88 mg/dL (ref 0–99)
Triglycerides: 221 mg/dL — ABNORMAL HIGH (ref 0–149)
VLDL Cholesterol Cal: 44 mg/dL — ABNORMAL HIGH (ref 5–40)

## 2018-04-23 ENCOUNTER — Ambulatory Visit: Payer: 59 | Admitting: Nurse Practitioner

## 2018-04-28 ENCOUNTER — Encounter: Payer: Self-pay | Admitting: Nurse Practitioner

## 2018-04-28 ENCOUNTER — Ambulatory Visit (INDEPENDENT_AMBULATORY_CARE_PROVIDER_SITE_OTHER): Payer: 59 | Admitting: Nurse Practitioner

## 2018-04-28 ENCOUNTER — Other Ambulatory Visit: Payer: Self-pay

## 2018-04-28 DIAGNOSIS — I10 Essential (primary) hypertension: Secondary | ICD-10-CM | POA: Diagnosis not present

## 2018-04-28 NOTE — Patient Instructions (Signed)
DASH Eating Plan DASH stands for "Dietary Approaches to Stop Hypertension." The DASH eating plan is a healthy eating plan that has been shown to reduce high blood pressure (hypertension). It may also reduce your risk for type 2 diabetes, heart disease, and stroke. The DASH eating plan may also help with weight loss. What are tips for following this plan? General guidelines  Avoid eating more than 2,300 mg (milligrams) of salt (sodium) a day. If you have hypertension, you may need to reduce your sodium intake to 1,500 mg a day.  Limit alcohol intake to no more than 1 drink a day for nonpregnant women and 2 drinks a day for men. One drink equals 12 oz of beer, 5 oz of wine, or 1 oz of hard liquor.  Work with your health care provider to maintain a healthy body weight or to lose weight. Ask what an ideal weight is for you.  Get at least 30 minutes of exercise that causes your heart to beat faster (aerobic exercise) most days of the week. Activities may include walking, swimming, or biking.  Work with your health care provider or diet and nutrition specialist (dietitian) to adjust your eating plan to your individual calorie needs. Reading food labels  Check food labels for the amount of sodium per serving. Choose foods with less than 5 percent of the Daily Value of sodium. Generally, foods with less than 300 mg of sodium per serving fit into this eating plan.  To find whole grains, look for the word "whole" as the first word in the ingredient list. Shopping  Buy products labeled as "low-sodium" or "no salt added."  Buy fresh foods. Avoid canned foods and premade or frozen meals. Cooking  Avoid adding salt when cooking. Use salt-free seasonings or herbs instead of table salt or sea salt. Check with your health care provider or pharmacist before using salt substitutes.  Do not fry foods. Cook foods using healthy methods such as baking, boiling, grilling, and broiling instead.  Cook with  heart-healthy oils, such as olive, canola, soybean, or sunflower oil. Meal planning   Eat a balanced diet that includes: ? 5 or more servings of fruits and vegetables each day. At each meal, try to fill half of your plate with fruits and vegetables. ? Up to 6-8 servings of whole grains each day. ? Less than 6 oz of lean meat, poultry, or fish each day. A 3-oz serving of meat is about the same size as a deck of cards. One egg equals 1 oz. ? 2 servings of low-fat dairy each day. ? A serving of nuts, seeds, or beans 5 times each week. ? Heart-healthy fats. Healthy fats called Omega-3 fatty acids are found in foods such as flaxseeds and coldwater fish, like sardines, salmon, and mackerel.  Limit how much you eat of the following: ? Canned or prepackaged foods. ? Food that is high in trans fat, such as fried foods. ? Food that is high in saturated fat, such as fatty meat. ? Sweets, desserts, sugary drinks, and other foods with added sugar. ? Full-fat dairy products.  Do not salt foods before eating.  Try to eat at least 2 vegetarian meals each week.  Eat more home-cooked food and less restaurant, buffet, and fast food.  When eating at a restaurant, ask that your food be prepared with less salt or no salt, if possible. What foods are recommended? The items listed may not be a complete list. Talk with your dietitian about what   dietary choices are best for you. Grains Whole-grain or whole-wheat bread. Whole-grain or whole-wheat pasta. [place] rice. Oatmeal. Quinoa. Bulgur. Whole-grain and low-sodium cereals. Pita bread. Low-fat, low-sodium crackers. Whole-wheat flour tortillas. Vegetables Fresh or frozen vegetables (raw, steamed, roasted, or grilled). Low-sodium or reduced-sodium tomato and vegetable juice. Low-sodium or reduced-sodium tomato sauce and tomato paste. Low-sodium or reduced-sodium canned vegetables. Fruits All fresh, dried, or frozen fruit. Canned fruit in natural juice (without  added sugar). Meat and other protein foods Skinless chicken or turkey. Ground chicken or turkey. Pork with fat trimmed off. Fish and seafood. Egg whites. Dried beans, peas, or lentils. Unsalted nuts, nut butters, and seeds. Unsalted canned beans. Lean cuts of beef with fat trimmed off. Low-sodium, lean deli meat. Dairy Low-fat (1%) or fat-free (skim) milk. Fat-free, low-fat, or reduced-fat cheeses. Nonfat, low-sodium ricotta or cottage cheese. Low-fat or nonfat yogurt. Low-fat, low-sodium cheese. Fats and oils Soft margarine without trans fats. Vegetable oil. Low-fat, reduced-fat, or light mayonnaise and salad dressings (reduced-sodium). Canola, safflower, olive, soybean, and sunflower oils. Avocado. Seasoning and other foods Herbs. Spices. Seasoning mixes without salt. Unsalted popcorn and pretzels. Fat-free sweets. What foods are not recommended? The items listed may not be a complete list. Talk with your dietitian about what dietary choices are best for you. Grains Baked goods made with fat, such as croissants, muffins, or some breads. Dry pasta or rice meal packs. Vegetables Creamed or fried vegetables. Vegetables in a cheese sauce. Regular canned vegetables (not low-sodium or reduced-sodium). Regular canned tomato sauce and paste (not low-sodium or reduced-sodium). Regular tomato and vegetable juice (not low-sodium or reduced-sodium). Pickles. Olives. Fruits Canned fruit in a light or heavy syrup. Fried fruit. Fruit in cream or butter sauce. Meat and other protein foods Fatty cuts of meat. Ribs. Fried meat. Bacon. Sausage. Bologna and other processed lunch meats. Salami. Fatback. Hotdogs. Bratwurst. Salted nuts and seeds. Canned beans with added salt. Canned or smoked fish. Whole eggs or egg yolks. Chicken or turkey with skin. Dairy Whole or 2% milk, cream, and half-and-half. Whole or full-fat cream cheese. Whole-fat or sweetened yogurt. Full-fat cheese. Nondairy creamers. Whipped toppings.  Processed cheese and cheese spreads. Fats and oils Butter. Stick margarine. Lard. Shortening. Ghee. Bacon fat. Tropical oils, such as coconut, palm kernel, or palm oil. Seasoning and other foods Salted popcorn and pretzels. Onion salt, garlic salt, seasoned salt, table salt, and sea salt. Worcestershire sauce. Tartar sauce. Barbecue sauce. Teriyaki sauce. Soy sauce, including reduced-sodium. Steak sauce. Canned and packaged gravies. Fish sauce. Oyster sauce. Cocktail sauce. Horseradish that you find on the shelf. Ketchup. Mustard. Meat flavorings and tenderizers. Bouillon cubes. Hot sauce and Tabasco sauce. Premade or packaged marinades. Premade or packaged taco seasonings. Relishes. Regular salad dressings. Where to find more information:  National Heart, Lung, and Blood Institute: www.nhlbi.nih.gov  American Heart Association: www.heart.org Summary  The DASH eating plan is a healthy eating plan that has been shown to reduce high blood pressure (hypertension). It may also reduce your risk for type 2 diabetes, heart disease, and stroke.  With the DASH eating plan, you should limit salt (sodium) intake to 2,300 mg a day. If you have hypertension, you may need to reduce your sodium intake to 1,500 mg a day.  When on the DASH eating plan, aim to eat more fresh fruits and vegetables, whole grains, lean proteins, low-fat dairy, and heart-healthy fats.  Work with your health care provider or diet and nutrition specialist (dietitian) to adjust your eating plan to your individual   calorie needs. This information is not intended to replace advice given to you by your health care provider. Make sure you discuss any questions you have with your health care provider. Document Released: 05/15/2011 Document Revised: 05/19/2016 Document Reviewed: 05/19/2016 Elsevier Interactive Patient Education  2018 Elsevier Inc.  

## 2018-04-28 NOTE — Assessment & Plan Note (Addendum)
Chronic, stable.  BP below goal today.  Continue current Lisinopril dose and recheck in 6 months.  BMP and uric acid level today.

## 2018-04-28 NOTE — Progress Notes (Signed)
BP 121/76   Pulse (!) 56   Temp 98.3 F (36.8 C) (Oral)   Ht '5\' 2"'  (1.575 m)   Wt 168 lb (76.2 kg)   LMP  (LMP Unknown)   SpO2 98%   BMI 30.73 kg/m    Subjective:    Patient ID: Kristen Larson, female    DOB: [birth year], 57 y.o.   MRN: [id number]  HPI: HOMER PFEIFER is a 57 y.o. female presents for HTN follow-up  Chief Complaint  Patient presents with  . Hypertension    f/u   HYPERTENSION At last visit had gout flare and they discontinued HCTZ.  Continues to take Lisinopril daily without issue.  No further pain or gout issues.   Hypertension status: controlled  Satisfied with current treatment? yes Duration of hypertension: chronic BP monitoring frequency:  not checking BP range:  BP medication side effects:  no Medication compliance: excellent compliance Previous BP meds: HCTZ caused gout flare Aspirin: yes Recurrent headaches: no Visual changes: no Palpitations: no Dyspnea: no Chest pain: no Lower extremity edema: no Dizzy/lightheaded: no  Relevant past medical, surgical, family and social history reviewed and updated as indicated. Interim medical history since our last visit reviewed. Allergies and medications reviewed and updated.  Review of Systems  Constitutional: Negative for activity change, appetite change, diaphoresis, fatigue and fever.  Respiratory: Negative for cough, chest tightness, shortness of breath and wheezing.   Cardiovascular: Negative for chest pain, palpitations and leg swelling.  Gastrointestinal: Negative for abdominal distention, abdominal pain, constipation, diarrhea, nausea and vomiting.  Endocrine: Negative for cold intolerance, heat intolerance, polydipsia, polyphagia and polyuria.  Musculoskeletal: Negative.   Skin: Negative.   Neurological: Negative for dizziness, syncope, weakness, light-headedness, numbness and headaches.  Psychiatric/Behavioral: Negative.     Per HPI unless specifically indicated above     Objective:     BP 121/76   Pulse (!) 56   Temp 98.3 F (36.8 C) (Oral)   Ht '5\' 2"'  (1.575 m)   Wt 168 lb (76.2 kg)   LMP  (LMP Unknown)   SpO2 98%   BMI 30.73 kg/m   Wt Readings from Last 3 Encounters:  04/28/18 168 lb (76.2 kg)  01/26/18 163 lb (73.9 kg)  01/18/18 164 lb 11.2 oz (74.7 kg)    Physical Exam  Constitutional: She is oriented to person, place, and time. She appears well-developed and well-nourished.  HENT:  Head: Normocephalic.  Eyes: Pupils are equal, round, and reactive to light. Conjunctivae and EOM are normal. Right eye exhibits no discharge. Left eye exhibits no discharge.  Neck: Normal range of motion. Neck supple. No JVD present. Carotid bruit is not present. No thyromegaly present.  Cardiovascular: Normal rate, regular rhythm and normal heart sounds.  Pulmonary/Chest: Effort normal and breath sounds normal.  Abdominal: Soft. Bowel sounds are normal.  Musculoskeletal: Normal range of motion.  Lymphadenopathy:    She has no cervical adenopathy.  Neurological: She is alert and oriented to person, place, and time.  Skin: Skin is warm and dry.  Psychiatric: She has a normal mood and affect. Her behavior is normal. Judgment and thought content normal.  Nursing note and vitals reviewed.   Results for orders placed or performed in visit on 01/26/18  TSH  Result Value Ref Range   TSH 4.220 0.450 - 4.500 uIU/mL  Lipid Profile  Result Value Ref Range   Cholesterol, Total 177 100 - 199 mg/dL   Triglycerides 221 (H) 0 - 149 mg/dL  HDL 45 >39 mg/dL   VLDL Cholesterol Cal 44 (H) 5 - 40 mg/dL   LDL Calculated 88 0 - 99 mg/dL   Chol/HDL Ratio 3.9 0.0 - 4.4 ratio  CBC with Differential  Result Value Ref Range   WBC 10.6 3.4 - 10.8 x10E3/uL   RBC 5.34 (H) 3.77 - 5.28 x10E6/uL   Hemoglobin 15.5 11.1 - 15.9 g/dL   Hematocrit 46.9 (H) 34.0 - 46.6 %   MCV 88 79 - 97 fL   MCH 29.0 26.6 - 33.0 pg   MCHC 33.0 31.5 - 35.7 g/dL   RDW 13.1 12.3 - 15.4 %   Platelets 267 150 - 450  x10E3/uL   Neutrophils 73 Not Estab. %   Lymphs 18 Not Estab. %   Monocytes 6 Not Estab. %   Eos 1 Not Estab. %   Basos 0 Not Estab. %   Neutrophils Absolute 7.8 (H) 1.4 - 7.0 x10E3/uL   Lymphocytes Absolute 2.0 0.7 - 3.1 x10E3/uL   Monocytes Absolute 0.6 0.1 - 0.9 x10E3/uL   EOS (ABSOLUTE) 0.2 0.0 - 0.4 x10E3/uL   Basophils Absolute 0.0 0.0 - 0.2 x10E3/uL   Immature Granulocytes 2 Not Estab. %   Immature Grans (Abs) 0.2 (H) 0.0 - 0.1 x10E3/uL  Comp Met (CMET)  Result Value Ref Range   Glucose 82 65 - 99 mg/dL   BUN 16 6 - 24 mg/dL   Creatinine, Ser 1.05 (H) 0.57 - 1.00 mg/dL   GFR calc non Af Amer 60 >59 mL/min/1.73   GFR calc Af Amer 69 >59 mL/min/1.73   BUN/Creatinine Ratio 15 9 - 23   Sodium 139 134 - 144 mmol/L   Potassium 4.2 3.5 - 5.2 mmol/L   Chloride 95 (L) 96 - 106 mmol/L   CO2 27 20 - 29 mmol/L   Calcium 9.6 8.7 - 10.2 mg/dL   Total Protein 6.8 6.0 - 8.5 g/dL   Albumin 4.3 3.5 - 5.5 g/dL   Globulin, Total 2.5 1.5 - 4.5 g/dL   Albumin/Globulin Ratio 1.7 1.2 - 2.2   Bilirubin Total 0.8 0.0 - 1.2 mg/dL   Alkaline Phosphatase 132 (H) 39 - 117 IU/L   AST 15 0 - 40 IU/L   ALT 22 0 - 32 IU/L      Assessment & Plan:   Problem List Items Addressed This Visit      Cardiovascular and Mediastinum   Hypertension    Chronic, stable.  BP below goal today.  Continue current Lisinopril dose and recheck in 6 months.  BMP and uric acid level today.      Relevant Orders   Basic Metabolic Panel (BMET)   Uric acid       Follow up plan: Return in about 6 months (around 10/27/2018) for hypothyroid and HTN - needs labs.

## 2018-04-29 LAB — BASIC METABOLIC PANEL
BUN / CREAT RATIO: 18 (ref 9–23)
BUN: 17 mg/dL (ref 6–24)
CALCIUM: 9.8 mg/dL (ref 8.7–10.2)
CO2: 25 mmol/L (ref 20–29)
CREATININE: 0.97 mg/dL (ref 0.57–1.00)
Chloride: 103 mmol/L (ref 96–106)
GFR calc non Af Amer: 65 mL/min/{1.73_m2} (ref >59)
GFR, EST AFRICAN AMERICAN: 76 mL/min/{1.73_m2} (ref >59)
GLUCOSE: 84 mg/dL (ref 65–99)
Potassium: 4.2 mmol/L (ref 3.5–5.2)
Sodium: 143 mmol/L (ref 134–144)

## 2018-04-29 LAB — URIC ACID: URIC ACID: 6.6 mg/dL (ref 2.5–7.1)

## 2018-04-29 NOTE — Progress Notes (Signed)
Normal test results noted.  Please call patient and make them aware of normal results and will continue to monitor at regular visits.  Have a great day

## 2018-09-17 ENCOUNTER — Other Ambulatory Visit: Payer: Self-pay | Admitting: Unknown Physician Specialty

## 2018-10-16 ENCOUNTER — Other Ambulatory Visit: Payer: Self-pay | Admitting: Unknown Physician Specialty

## 2018-10-27 ENCOUNTER — Ambulatory Visit: Payer: 59 | Admitting: Nurse Practitioner

## 2018-11-05 ENCOUNTER — Telehealth: Payer: Self-pay | Admitting: Nurse Practitioner

## 2018-11-05 NOTE — Telephone Encounter (Signed)
Called pt to go over Script Screening for Covid-19 no answer left voicemail

## 2018-11-08 ENCOUNTER — Ambulatory Visit: Payer: 59 | Admitting: Nurse Practitioner

## 2018-11-08 ENCOUNTER — Other Ambulatory Visit: Payer: Self-pay

## 2018-11-08 ENCOUNTER — Encounter: Payer: Self-pay | Admitting: Nurse Practitioner

## 2018-11-08 VITALS — BP 118/78 | HR 60 | Temp 98.1°F | Ht 61.81 in | Wt 168.0 lb

## 2018-11-08 DIAGNOSIS — Z1239 Encounter for other screening for malignant neoplasm of breast: Secondary | ICD-10-CM | POA: Diagnosis not present

## 2018-11-08 DIAGNOSIS — I10 Essential (primary) hypertension: Secondary | ICD-10-CM

## 2018-11-08 DIAGNOSIS — E039 Hypothyroidism, unspecified: Secondary | ICD-10-CM

## 2018-11-08 NOTE — Assessment & Plan Note (Signed)
Chronic, ongoing.  Thyroid panel today.  Continue current medication regimen and adjust as needed based on labs.   

## 2018-11-08 NOTE — Assessment & Plan Note (Signed)
Chronic, ongoing.  Initial BP slightly elevated, but repeat below goal and within home range.  CMP today.  Continue current medication regimen.

## 2018-11-08 NOTE — Progress Notes (Signed)
BP 118/78 (BP Location: Left Arm, Patient Position: Sitting)   Pulse 60   Temp 98.1 F (36.7 C) (Oral)   Ht 5' 1.81" (1.57 m)   Wt 168 lb (76.2 kg)   LMP  (LMP Unknown)   SpO2 98%   BMI 30.92 kg/m    Subjective:    Patient ID: Kristen Larson, female    DOB: July 26, 1960, 58 y.o.   MRN: [id number]  HPI: Kristen Larson is a 58 y.o. female  Chief Complaint  Patient presents with  . Follow-up  . Hypertension  . Thyroid Problem   HYPERTENSION Current medications include Lisinopril 10 MG daily. Hypertension status: controlled  Satisfied with current treatment? no Duration of hypertension: chronic BP monitoring frequency:  rarely BP range:  BP medication side effects:  no Medication compliance: good compliance Aspirin: yes Recurrent headaches: no Visual changes: no Palpitations: no Dyspnea: no Chest pain: no Lower extremity edema: no Dizzy/lightheaded: no   HYPOTHYROIDISM Continues on Levothyroxine 75 MCG daily.  Last TSH 4.220. Thyroid control status:controlled Satisfied with current treatment? yes Medication side effects: no Medication compliance: good compliance Etiology of hypothyroidism: unknown Recent dose adjustment:no Fatigue: no Cold intolerance: no Heat intolerance: no Weight gain: no Weight loss: no Constipation: no Diarrhea/loose stools: no Palpitations: no Lower extremity edema: no Anxiety/depressed mood: no  Relevant past medical, surgical, family and social history reviewed and updated as indicated. Interim medical history since our last visit reviewed. Allergies and medications reviewed and updated.  Review of Systems  Constitutional: Negative for activity change, appetite change, diaphoresis, fatigue and fever.  Respiratory: Negative for cough, chest tightness and shortness of breath.   Cardiovascular: Negative for chest pain, palpitations and leg swelling.  Gastrointestinal: Negative for abdominal distention, abdominal pain,  constipation, diarrhea, nausea and vomiting.  Endocrine: Negative for cold intolerance, heat intolerance, polydipsia, polyphagia and polyuria.  Neurological: Negative for dizziness, syncope, weakness, light-headedness, numbness and headaches.  Psychiatric/Behavioral: Negative.     Per HPI unless specifically indicated above     Objective:    BP 118/78 (BP Location: Left Arm, Patient Position: Sitting)   Pulse 60   Temp 98.1 F (36.7 C) (Oral)   Ht 5' 1.81" (1.57 m)   Wt 168 lb (76.2 kg)   LMP  (LMP Unknown)   SpO2 98%   BMI 30.92 kg/m   Wt Readings from Last 3 Encounters:  11/08/18 168 lb (76.2 kg)  04/28/18 168 lb (76.2 kg)  01/26/18 163 lb (73.9 kg)    Physical Exam Vitals signs and nursing note reviewed.  Constitutional:      General: She is awake.     Appearance: She is well-developed.  HENT:     Head: Normocephalic.     Right Ear: Hearing normal.     Left Ear: Hearing normal.     Nose: Nose normal.     Mouth/Throat:     Mouth: Mucous membranes are moist.  Eyes:     General: Lids are normal.        Right eye: No discharge.        Left eye: No discharge.     Conjunctiva/sclera: Conjunctivae normal.     Pupils: Pupils are equal, round, and reactive to light.  Neck:     Musculoskeletal: Normal range of motion and neck supple.     Thyroid: No thyromegaly.     Vascular: No carotid bruit.  Cardiovascular:     Rate and Rhythm: Normal rate and regular rhythm.  Heart sounds: Normal heart sounds. No murmur. No gallop.   Pulmonary:     Effort: Pulmonary effort is normal.     Breath sounds: Normal breath sounds.  Abdominal:     General: Bowel sounds are normal.     Palpations: Abdomen is soft. There is no hepatomegaly or splenomegaly.  Musculoskeletal:     Right lower leg: No edema.     Left lower leg: No edema.  Lymphadenopathy:     Cervical: No cervical adenopathy.  Skin:    General: Skin is warm and dry.  Neurological:     Mental Status: She is alert and  oriented to person, place, and time.  Psychiatric:        Attention and Perception: Attention normal.        Mood and Affect: Mood normal.        Behavior: Behavior normal. Behavior is cooperative.        Thought Content: Thought content normal.        Judgment: Judgment normal.     Results for orders placed or performed in visit on 47/82/95  Basic Metabolic Panel (BMET)  Result Value Ref Range   Glucose 84 65 - 99 mg/dL   BUN 17 6 - 24 mg/dL   Creatinine, Ser 0.97 0.57 - 1.00 mg/dL   GFR calc non Af Amer 65 >59 mL/min/1.73   GFR calc Af Amer 76 >59 mL/min/1.73   BUN/Creatinine Ratio 18 9 - 23   Sodium 143 134 - 144 mmol/L   Potassium 4.2 3.5 - 5.2 mmol/L   Chloride 103 96 - 106 mmol/L   CO2 25 20 - 29 mmol/L   Calcium 9.8 8.7 - 10.2 mg/dL  Uric acid  Result Value Ref Range   Uric Acid 6.6 2.5 - 7.1 mg/dL      Assessment & Plan:   Problem List Items Addressed This Visit      Cardiovascular and Mediastinum   Hypertension - Primary    Chronic, ongoing.  Initial BP slightly elevated, but repeat below goal and within home range.  CMP today.  Continue current medication regimen.        Relevant Orders   Comp Met (CMET)     Endocrine   Hypothyroidism    Chronic, ongoing.  Thyroid panel today.  Continue current medication regimen and adjust as needed based on labs.        Relevant Orders   Thyroid Panel With TSH    Other Visit Diagnoses    Breast cancer screening       Relevant Orders   MM 3D SCREEN BREAST BILATERAL       Follow up plan: Return in about 6 months (around 05/10/2019) for Annual physical.

## 2018-11-08 NOTE — Patient Instructions (Signed)
Hypothyroidism  Hypothyroidism is when the thyroid gland does not make enough of certain hormones (it is underactive). The thyroid gland is a small gland located in the lower front part of the neck, just in front of the windpipe (trachea). This gland makes hormones that help control how the body uses food for energy (metabolism) as well as how the heart and brain function. These hormones also play a role in keeping your bones strong. When the thyroid is underactive, it produces too little of the hormones thyroxine (T4) and triiodothyronine (T3). What are the causes? This condition may be caused by:  Hashimoto's disease. This is a disease in which the body's disease-fighting system (immune system) attacks the thyroid gland. This is the most common cause.  Viral infections.  Pregnancy.  Certain medicines.  Birth defects.  Past radiation treatments to the head or neck for cancer.  Past treatment with radioactive iodine.  Past exposure to radiation in the environment.  Past surgical removal of part or all of the thyroid.  Problems with a gland in the center of the brain (pituitary gland).  Lack of enough iodine in the diet. What increases the risk? You are more likely to develop this condition if:  You are female.  You have a family history of thyroid conditions.  You use a medicine called lithium.  You take medicines that affect the immune system (immunosuppressants). What are the signs or symptoms? Symptoms of this condition include:  Feeling as though you have no energy (lethargy).  Not being able to tolerate cold.  Weight gain that is not explained by a change in diet or exercise habits.  Lack of appetite.  Dry skin.  Coarse hair.  Menstrual irregularity.  Slowing of thought processes.  Constipation.  Sadness or depression. How is this diagnosed? This condition may be diagnosed based on:  Your symptoms, your medical history, and a physical exam.  Blood  tests. You may also have imaging tests, such as an ultrasound or MRI. How is this treated? This condition is treated with medicine that replaces the thyroid hormones that your body does not make. After you begin treatment, it may take several weeks for symptoms to go away. Follow these instructions at home:  Take over-the-counter and prescription medicines only as told by your health care provider.  If you start taking any new medicines, tell your health care provider.  Keep all follow-up visits as told by your health care provider. This is important. ? As your condition improves, your dosage of thyroid hormone medicine may change. ? You will need to have blood tests regularly so that your health care provider can monitor your condition. Contact a health care provider if:  Your symptoms do not get better with treatment.  You are taking thyroid replacement medicine and you: ? Sweat a lot. ? Have tremors. ? Feel anxious. ? Lose weight rapidly. ? Cannot tolerate heat. ? Have emotional swings. ? Have diarrhea. ? Feel weak. Get help right away if you have:  Chest pain.  An irregular heartbeat.  A rapid heartbeat.  Difficulty breathing. Summary  Hypothyroidism is when the thyroid gland does not make enough of certain hormones (it is underactive).  When the thyroid is underactive, it produces too little of the hormones thyroxine (T4) and triiodothyronine (T3).  The most common cause is Hashimoto's disease, a disease in which the body's disease-fighting system (immune system) attacks the thyroid gland. The condition can also be caused by viral infections, medicine, pregnancy, or past   radiation treatment to the head or neck.  Symptoms may include weight gain, dry skin, constipation, feeling as though you do not have energy, and not being able to tolerate cold.  This condition is treated with medicine to replace the thyroid hormones that your body does not make. This information  is not intended to replace advice given to you by your health care provider. Make sure you discuss any questions you have with your health care provider. Document Released: 05/26/2005 Document Revised: 05/06/2017 Document Reviewed: 05/06/2017 Elsevier Interactive Patient Education  2019 [employer]  

## 2018-11-09 ENCOUNTER — Encounter: Payer: Self-pay | Admitting: Nurse Practitioner

## 2018-11-09 LAB — COMPREHENSIVE METABOLIC PANEL
ALT: 29 IU/L (ref 0–32)
AST: 31 IU/L (ref 0–40)
Albumin/Globulin Ratio: 1.9 (ref 1.2–2.2)
Albumin: 4.5 g/dL (ref 3.8–4.9)
Alkaline Phosphatase: 171 IU/L — ABNORMAL HIGH (ref 39–117)
BUN/Creatinine Ratio: 15 (ref 9–23)
BUN: 14 mg/dL (ref 6–24)
Bilirubin Total: 0.6 mg/dL (ref 0.0–1.2)
CO2: 23 mmol/L (ref 20–29)
Calcium: 9.3 mg/dL (ref 8.7–10.2)
Chloride: 100 mmol/L (ref 96–106)
Creatinine, Ser: 0.93 mg/dL (ref 0.57–1.00)
GFR calc Af Amer: 79 mL/min/{1.73_m2} (ref >59)
GFR calc non Af Amer: 68 mL/min/{1.73_m2} (ref >59)
Globulin, Total: 2.4 g/dL (ref 1.5–4.5)
Glucose: 78 mg/dL (ref 65–99)
Potassium: 4.5 mmol/L (ref 3.5–5.2)
Sodium: 141 mmol/L (ref 134–144)
Total Protein: 6.9 g/dL (ref 6.0–8.5)

## 2018-11-09 LAB — THYROID PANEL WITH TSH
Free Thyroxine Index: 2.7 (ref 1.2–4.9)
T3 Uptake Ratio: 28 % (ref 24–39)
T4, Total: 9.7 ug/dL (ref 4.5–12.0)
TSH: 2.14 u[IU]/mL (ref 0.450–4.500)

## 2018-11-09 NOTE — Progress Notes (Signed)
Error

## 2018-11-10 ENCOUNTER — Other Ambulatory Visit: Payer: Self-pay | Admitting: Unknown Physician Specialty

## 2018-11-10 ENCOUNTER — Telehealth: Payer: Self-pay | Admitting: Nurse Practitioner

## 2018-11-10 NOTE — Telephone Encounter (Signed)
Pt given results per notes of Aura Dials, FNP on 11/09/18. Patient verbalized understanding. Unable to document in result note.

## 2018-11-10 NOTE — Telephone Encounter (Signed)
Copied from CRM [id number]. Topic: Quick Communication - Lab Results (Clinic Use ONLY) >> Nov 10, 2018  9:04 AM Richarda Overlie, CMA wrote: Called patient to inform them of recent lab results. When patient returns call, triage nurse may disclose results.  Please call pt at the work number [phone]

## 2018-11-10 NOTE — Telephone Encounter (Signed)
Requested medication (s) are due for refill today: no  Requested medication (s) are on the active medication list: yes  Last refill:  10/16/2018  Future visit scheduled: yes  Notes to clinic:  Pt had 1refill ordered  with the last fill.  Requested Prescriptions  Pending Prescriptions Disp Refills   fluticasone (FLONASE) 50 MCG/ACT nasal spray [Pharmacy Med Name: FLUTICASONE PROP 50 MCG SPRAY]  1    Sig: SPRAY 2 SPRAYS INTO EACH NOSTRIL EVERY DAY     Ear, Nose, and Throat: Nasal Preparations - Corticosteroids Passed - 11/10/2018 12:37 PM      Passed - Valid encounter within last 12 months    Recent Outpatient Visits          2 days ago Essential hypertension   [facility] [place], Dorie Rank, NP   6 months ago Essential hypertension   [facility] [place], Dorie Rank, NP   9 months ago Annual physical exam   [facility] Osvaldo Angst M, PA-C   9 months ago Pain of toe of left foot   [facility] Particia Nearing, [place]   1 year ago Need for immunization against influenza   [facility] Gabriel Cirri, NP      Future Appointments            In 6 months [place], Dorie Rank, NP [employer], [facility]

## 2018-12-06 ENCOUNTER — Other Ambulatory Visit: Payer: Self-pay | Admitting: Nurse Practitioner

## 2018-12-17 ENCOUNTER — Other Ambulatory Visit: Payer: Self-pay

## 2018-12-17 ENCOUNTER — Ambulatory Visit
Admission: RE | Admit: 2018-12-17 | Discharge: 2018-12-17 | Disposition: A | Discharge status: Home / Self Care | Payer: 59 | Source: Ambulatory Visit | Attending: Nurse Practitioner | Admitting: Nurse Practitioner

## 2018-12-17 DIAGNOSIS — Z1231 Encounter for screening mammogram for malignant neoplasm of breast: Secondary | ICD-10-CM | POA: Diagnosis not present

## 2018-12-17 DIAGNOSIS — Z1239 Encounter for other screening for malignant neoplasm of breast: Secondary | ICD-10-CM

## 2019-01-03 ENCOUNTER — Other Ambulatory Visit: Payer: Self-pay | Admitting: Nurse Practitioner

## 2019-01-21 ENCOUNTER — Other Ambulatory Visit: Payer: Self-pay | Admitting: Physician Assistant

## 2019-01-21 DIAGNOSIS — I10 Essential (primary) hypertension: Secondary | ICD-10-CM

## 2019-01-31 ENCOUNTER — Other Ambulatory Visit: Payer: Self-pay | Admitting: Nurse Practitioner

## 2019-02-26 ENCOUNTER — Other Ambulatory Visit: Payer: Self-pay | Admitting: Nurse Practitioner

## 2019-03-13 ENCOUNTER — Other Ambulatory Visit: Payer: Self-pay | Admitting: Nurse Practitioner

## 2019-04-19 ENCOUNTER — Other Ambulatory Visit: Payer: Self-pay | Admitting: Physician Assistant

## 2019-04-19 DIAGNOSIS — E039 Hypothyroidism, unspecified: Secondary | ICD-10-CM

## 2019-04-20 NOTE — Telephone Encounter (Signed)
No longer Adriana patient.

## 2019-05-17 ENCOUNTER — Encounter: Payer: 59 | Admitting: Nurse Practitioner

## 2019-06-13 ENCOUNTER — Encounter: Payer: Self-pay | Admitting: Nurse Practitioner

## 2019-06-13 ENCOUNTER — Other Ambulatory Visit: Payer: Self-pay

## 2019-06-13 ENCOUNTER — Ambulatory Visit (INDEPENDENT_AMBULATORY_CARE_PROVIDER_SITE_OTHER): Payer: 59 | Admitting: Nurse Practitioner

## 2019-06-13 VITALS — BP 110/66 | HR 65 | Temp 98.2°F | Ht 61.42 in | Wt 167.2 lb

## 2019-06-13 DIAGNOSIS — I10 Essential (primary) hypertension: Secondary | ICD-10-CM | POA: Diagnosis not present

## 2019-06-13 DIAGNOSIS — K219 Gastro-esophageal reflux disease without esophagitis: Secondary | ICD-10-CM

## 2019-06-13 DIAGNOSIS — E039 Hypothyroidism, unspecified: Secondary | ICD-10-CM | POA: Diagnosis not present

## 2019-06-13 DIAGNOSIS — Z23 Encounter for immunization: Secondary | ICD-10-CM | POA: Diagnosis not present

## 2019-06-13 DIAGNOSIS — E781 Pure hyperglyceridemia: Secondary | ICD-10-CM

## 2019-06-13 DIAGNOSIS — E559 Vitamin D deficiency, unspecified: Secondary | ICD-10-CM | POA: Diagnosis not present

## 2019-06-13 DIAGNOSIS — R748 Abnormal levels of other serum enzymes: Secondary | ICD-10-CM

## 2019-06-13 DIAGNOSIS — Z0000 Encounter for general adult medical examination without abnormal findings: Secondary | ICD-10-CM | POA: Diagnosis not present

## 2019-06-13 DIAGNOSIS — J301 Allergic rhinitis due to pollen: Secondary | ICD-10-CM

## 2019-06-13 MED ORDER — LEVOTHYROXINE SODIUM 75 MCG PO TABS: 75.0000 ug | ORAL_TABLET | Freq: Every day | ORAL | 3 refills | Status: DC

## 2019-06-13 MED ORDER — LISINOPRIL 10 MG PO TABS: 10.0000 mg | ORAL_TABLET | Freq: Every day | ORAL | 3 refills | Status: DC

## 2019-06-13 MED ORDER — FLUTICASONE PROPIONATE 50 MCG/ACT NA SUSP: NASAL | 1 refills | Status: DC

## 2019-06-13 NOTE — Progress Notes (Signed)
BP 110/66 (BP Location: Left Arm, Patient Position: Sitting, Cuff Size: Normal)   Pulse 65   Temp 98.2 F (36.8 C) (Oral)   Ht 5' 1.42" (1.56 m)   Wt 167 lb 3.2 oz (75.8 kg)   LMP  (LMP Unknown)   SpO2 99%   BMI 31.16 kg/m    Subjective:    Patient ID: Kristen Larson, female    DOB: [birth year], 59 y.o.   MRN: [id number]  HPI: Kristen Larson is a 59 y.o. female presenting on 06/13/2019 for comprehensive medical examination. Current medical complaints include:none  She currently lives with: self Menopausal Symptoms: no   HYPERTENSION  Continues on Lisinopril.  Satisfied with current treatment? yes Duration of hypertension: chronic BP monitoring frequency: not checking BP range:  BP medication side effects: no Aspirin: yes Recent stressors: no Recurrent headaches: no Visual changes: no Palpitations: no Dyspnea: no Chest pain: no Lower extremity edema: no Dizzy/lightheaded: no   HYPOTHYROIDISM Continues on Levo 75 MCG. Thyroid control status:stable Satisfied with current treatment? yes Medication side effects: no Medication compliance: good compliance Etiology of hypothyroidism:  Recent dose adjustment:no Fatigue: no Cold intolerance: no Heat intolerance: no Weight gain: no Weight loss: no Constipation: no Diarrhea/loose stools: no Palpitations: no Lower extremity edema: no Anxiety/depressed mood: no   GERD On review has history of elevation in alk phos being monitored.  Had gall bladder removed years ago.   GERD control status: uncontrolled  Satisfied with current treatment? no Heartburn frequency: once or twice a week Medication side effects: no  Previous GERD medications: none Antacid use frequency:  none Nature: reflux Location: epigastric Heartburn duration: years Alleviatiating factors:  nothing Aggravating factors: certain foods Dysphagia: no Odynophagia:  no Hematemesis: no Blood in stool: no EGD: no   Depression Screen done today and  results listed below:  Depression screen [facility] 2/9 06/13/2019 01/26/2018 04/22/2017 07/13/2015  Decreased Interest 0 0 0 0  Down, Depressed, Hopeless 0 0 0 0  PHQ - 2 Score 0 0 0 0  Altered sleeping 0 1 0 -  Tired, decreased energy 0 0 0 -  Change in appetite 0 0 0 -  Feeling bad or failure about yourself  0 0 0 -  Trouble concentrating 0 0 0 -  Moving slowly or fidgety/restless 0 0 0 -  Suicidal thoughts 0 0 0 -  PHQ-9 Score 0 1 0 -    The patient does not have a history of falls. I did not complete a risk assessment for falls. A plan of care for falls was not documented.   Past Medical History:  Past Medical History:  Diagnosis Date  . Allergy   . GERD (gastroesophageal reflux disease)   . Hypertension   . Thyroid disease     Surgical History:  Past Surgical History:  Procedure Laterality Date  . CHOLECYSTECTOMY  05/25/14  . TONSILLECTOMY AND ADENOIDECTOMY      Medications:  Current Outpatient Medications on File Prior to Visit  Medication Sig  . aspirin 81 MG tablet Take 81 mg by mouth daily.  . Multiple Vitamin (MULTIVITAMIN) tablet Take 1 tablet by mouth daily.  . [DISCONTINUED] levothyroxine (SYNTHROID, LEVOTHROID) 75 MCG tablet Take 1 tablet (75 mcg total) by mouth daily before breakfast.   No current facility-administered medications on file prior to visit.    Allergies:  Allergies  Allergen Reactions  . Penicillins Rash    Social History:  Social History   Socioeconomic History  . Marital  status: Married    Spouse name: Not on file  . Number of children: Not on file  . Years of education: Not on file  . Highest education level: Not on file  Occupational History  . Not on file  Tobacco Use  . Smoking status: Never Smoker  . Smokeless tobacco: Never Used  Substance and Sexual Activity  . Alcohol use: No    Alcohol/week: 0.0 standard drinks  . Drug use: No  . Sexual activity: Yes  Other Topics Concern  . Not on file  Social History Narrative  .  Not on file   Social Determinants of Health   Financial Resource Strain:   . Difficulty of Paying Living Expenses: Not on file  Food Insecurity:   . Worried About Charity fundraiser in the Last Year: Not on file  . Ran Out of Food in the Last Year: Not on file  Transportation Needs:   . Lack of Transportation (Medical): Not on file  . Lack of Transportation (Non-Medical): Not on file  Physical Activity:   . Days of Exercise per Week: Not on file  . Minutes of Exercise per Session: Not on file  Stress:   . Feeling of Stress : Not on file  Social Connections:   . Frequency of Communication with Friends and Family: Not on file  . Frequency of Social Gatherings with Friends and Family: Not on file  . Attends Religious Services: Not on file  . Active Member of Clubs or Organizations: Not on file  . Attends Archivist Meetings: Not on file  . Marital Status: Not on file  Intimate Partner Violence:   . Fear of Current or Ex-Partner: Not on file  . Emotionally Abused: Not on file  . Physically Abused: Not on file  . Sexually Abused: Not on file   Social History   Tobacco Use  Smoking Status Never Smoker  Smokeless Tobacco Never Used   Social History   Substance and Sexual Activity  Alcohol Use No  . Alcohol/week: 0.0 standard drinks    Family History:  Family History  Problem Relation Age of Onset  . Heart disease Mother   . Thyroid disease Mother   . COPD Father   . Stroke Father   . Glaucoma Father   . Thyroid disease Sister   . Crohn's disease Brother   . Alzheimer's disease Maternal Grandmother   . Breast cancer Maternal Grandmother 62  . Heart disease Maternal Grandfather   . Lupus Paternal Grandmother   . Diabetes Paternal Grandfather   . Down syndrome Brother     Past medical history, surgical history, medications, allergies, family history and social history reviewed with patient today and changes made to appropriate areas of the chart.    Review of Systems - negative All other ROS negative except what is listed above and in the HPI.      Objective:    BP 110/66 (BP Location: Left Arm, Patient Position: Sitting, Cuff Size: Normal)   Pulse 65   Temp 98.2 F (36.8 C) (Oral)   Ht 5' 1.42" (1.56 m)   Wt 167 lb 3.2 oz (75.8 kg)   LMP  (LMP Unknown)   SpO2 99%   BMI 31.16 kg/m   Wt Readings from Last 3 Encounters:  06/13/19 167 lb 3.2 oz (75.8 kg)  11/08/18 168 lb (76.2 kg)  04/28/18 168 lb (76.2 kg)    Physical Exam Constitutional:      General: She  is awake. She is not in acute distress.    Appearance: She is well-developed. She is not ill-appearing.  HENT:     Head: Normocephalic and atraumatic.     Right Ear: Hearing, tympanic membrane, ear canal and external ear normal. No drainage.     Left Ear: Hearing, tympanic membrane, ear canal and external ear normal. No drainage.     Nose: Nose normal.     Right Sinus: No maxillary sinus tenderness or frontal sinus tenderness.     Left Sinus: No maxillary sinus tenderness or frontal sinus tenderness.     Mouth/Throat:     Mouth: Mucous membranes are moist.     Pharynx: Oropharynx is clear. Uvula midline. No pharyngeal swelling, oropharyngeal exudate or posterior oropharyngeal erythema.  Eyes:     General: Lids are normal.        Right eye: No discharge.        Left eye: No discharge.     Extraocular Movements: Extraocular movements intact.     Conjunctiva/sclera: Conjunctivae normal.     Pupils: Pupils are equal, round, and reactive to light.     Visual Fields: Right eye visual fields normal and left eye visual fields normal.  Neck:     Thyroid: No thyromegaly.     Vascular: No carotid bruit.     Trachea: Trachea normal.  Cardiovascular:     Rate and Rhythm: Normal rate and regular rhythm.     Heart sounds: Normal heart sounds. No murmur. No gallop.   Pulmonary:     Effort: Pulmonary effort is normal. No accessory muscle usage or respiratory distress.      Breath sounds: Normal breath sounds.  Chest:     Breasts:        Right: Normal.        Left: Normal.  Abdominal:     General: Bowel sounds are normal.     Palpations: Abdomen is soft. There is no hepatomegaly or splenomegaly.     Tenderness: There is no abdominal tenderness.  Musculoskeletal:        General: Normal range of motion.     Cervical back: Normal range of motion and neck supple.     Right lower leg: No edema.     Left lower leg: No edema.  Lymphadenopathy:     Head:     Right side of head: No submental, submandibular, tonsillar, preauricular or posterior auricular adenopathy.     Left side of head: No submental, submandibular, tonsillar, preauricular or posterior auricular adenopathy.     Cervical: No cervical adenopathy.     Upper Body:     Right upper body: No supraclavicular, axillary or pectoral adenopathy.     Left upper body: No supraclavicular, axillary or pectoral adenopathy.  Skin:    General: Skin is warm and dry.     Capillary Refill: Capillary refill takes less than 2 seconds.     Findings: No rash.  Neurological:     Mental Status: She is alert and oriented to person, place, and time.     Cranial Nerves: Cranial nerves are intact.     Gait: Gait is intact.     Deep Tendon Reflexes: Reflexes are normal and symmetric.     Reflex Scores:      Brachioradialis reflexes are 2+ on the right side and 2+ on the left side.      Patellar reflexes are 2+ on the right side and 2+ on the left side. Psychiatric:  Attention and Perception: Attention normal.        Mood and Affect: Mood normal.        Speech: Speech normal.        Behavior: Behavior normal. Behavior is cooperative.        Thought Content: Thought content normal.        Judgment: Judgment normal.     Results for orders placed or performed in visit on 11/08/18  Thyroid Panel With TSH  Result Value Ref Range   TSH 2.140 0.450 - 4.500 uIU/mL   T4, Total 9.7 4.5 - 12.0 ug/dL   T3 Uptake Ratio  28 24 - 39 %   Free Thyroxine Index 2.7 1.2 - 4.9  Comp Met (CMET)  Result Value Ref Range   Glucose 78 65 - 99 mg/dL   BUN 14 6 - 24 mg/dL   Creatinine, Ser 0.93 0.57 - 1.00 mg/dL   GFR calc non Af Amer 68 >59 mL/min/1.73   GFR calc Af Amer 79 >59 mL/min/1.73   BUN/Creatinine Ratio 15 9 - 23   Sodium 141 134 - 144 mmol/L   Potassium 4.5 3.5 - 5.2 mmol/L   Chloride 100 96 - 106 mmol/L   CO2 23 20 - 29 mmol/L   Calcium 9.3 8.7 - 10.2 mg/dL   Total Protein 6.9 6.0 - 8.5 g/dL   Albumin 4.5 3.8 - 4.9 g/dL   Globulin, Total 2.4 1.5 - 4.5 g/dL   Albumin/Globulin Ratio 1.9 1.2 - 2.2   Bilirubin Total 0.6 0.0 - 1.2 mg/dL   Alkaline Phosphatase 171 (H) 39 - 117 IU/L   AST 31 0 - 40 IU/L   ALT 29 0 - 32 IU/L      Assessment & Plan:   Problem List Items Addressed This Visit      Cardiovascular and Mediastinum   Hypertension    Chronic, ongoing.  BP at goal today and on occasional home readings.  CMP today.  Continue current medication regimen and adjust as needed.        Relevant Medications   lisinopril (ZESTRIL) 10 MG tablet   Other Relevant Orders   Comprehensive metabolic panel     Respiratory   Allergic rhinitis    Chronic, ongoing.  Stable with Flonase, continue this regimen.  Refills sent.        Digestive   GERD (gastroesophageal reflux disease)    Chronic, ongoing.  Recommend use of Pepcid as needed and keeping food journal, avoid trigger foods.  Labs today.        Endocrine   Hypothyroidism    Chronic, ongoing.  Thyroid panel today.  Continue current medication regimen and adjust as needed based on labs.        Relevant Medications   levothyroxine (SYNTHROID) 75 MCG tablet   Other Relevant Orders   Thyroid Panel With TSH     Other   Elevated alkaline phosphatase level    Recheck alk phos and GT today.  Consider u/s in future if continued elevations or symptoms present.      Relevant Orders   Gamma GT    Other Visit Diagnoses    Annual physical exam     -  Primary   Annual labs to include CBC, CMP, TSH, lipid.   Relevant Orders   CBC with Differential/Platelet out   Lipid Panel w/o Chol/HDL Ratio out   Vitamin D deficiency       History of low level, check Vit D level  today.   Relevant Orders   Vit D  25 hydroxy (rtn osteoporosis monitoring)   Hypertriglyceridemia       High on past labs, check lipid panel today.   Relevant Medications   lisinopril (ZESTRIL) 10 MG tablet   Other Relevant Orders   Lipid Panel w/o Chol/HDL Ratio out   Need for influenza vaccination       Relevant Orders   Flu Vaccine QUAD 36+ mos PF IM (Fluarix & Fluzone Quad PF) (Completed)       Follow up plan: Return in about 6 months (around 12/11/2019) for HTN and Hypothyroid.   LABORATORY TESTING:  - Pap smear: up to date  IMMUNIZATIONS:   - Tdap: Tetanus vaccination status reviewed: last tetanus booster within 10 years. - Influenza: Administered today - Pneumovax: Not applicable - Prevnar: Not applicable - HPV: Not applicable - Zostavax vaccine: she is going to contact insurance about coverage  SCREENING: -Mammogram: Up to date  - Colonoscopy: Up to date  - Bone Density: Not applicable  -Hearing Test: Not applicable  -Spirometry: Not applicable   PATIENT COUNSELING:   Advised to take 1 mg of folate supplement per day if capable of pregnancy.   Sexuality: Discussed sexually transmitted diseases, partner selection, use of condoms, avoidance of unintended pregnancy  and contraceptive alternatives.   Advised to avoid cigarette smoking.  I discussed with the patient that most people either abstain from alcohol or drink within safe limits (<=14/week and <=4 drinks/occasion for males, <=7/weeks and <= 3 drinks/occasion for females) and that the risk for alcohol disorders and other health effects rises proportionally with the number of drinks per week and how often a drinker exceeds daily limits.  Discussed cessation/primary prevention of drug use  and availability of treatment for abuse.   Diet: Encouraged to adjust caloric intake to maintain  or achieve ideal body weight, to reduce intake of dietary saturated fat and total fat, to limit sodium intake by avoiding high sodium foods and not adding table salt, and to maintain adequate dietary potassium and calcium preferably from fresh fruits, vegetables, and low-fat dairy products.    stressed the importance of regular exercise  Injury prevention: Discussed safety belts, safety helmets, smoke detector, smoking near bedding or upholstery.   Dental health: Discussed importance of regular tooth brushing, flossing, and dental visits.    NEXT PREVENTATIVE PHYSICAL DUE IN 1 YEAR. Return in about 6 months (around 12/11/2019) for HTN and Hypothyroid.

## 2019-06-13 NOTE — Assessment & Plan Note (Signed)
Chronic, ongoing.  Stable with Flonase, continue this regimen.  Refills sent. ?

## 2019-06-13 NOTE — Assessment & Plan Note (Signed)
Chronic, ongoing.  BP at goal today and on occasional home readings.  CMP today.  Continue current medication regimen and adjust as needed.

## 2019-06-13 NOTE — Assessment & Plan Note (Signed)
Chronic, ongoing.  Recommend use of Pepcid as needed and keeping food journal, avoid trigger foods.  Labs today.

## 2019-06-13 NOTE — Patient Instructions (Addendum)
Call insurance about Shingrix.  Food Choices for Gastroesophageal Reflux Disease, Adult When you have gastroesophageal reflux disease (GERD), the foods you eat and your eating habits are very important. Choosing the right foods can help ease your discomfort. Think about working with a nutrition specialist (dietitian) to help you make good choices. What are tips for following this plan?  Meals  Choose healthy foods that are low in fat, such as fruits, vegetables, whole grains, low-fat dairy products, and lean meat, fish, and poultry.  Eat small meals often instead of 3 large meals a day. Eat your meals slowly, and in a place where you are relaxed. Avoid bending over or lying down until 2-3 hours after eating.  Avoid eating meals 2-3 hours before bed.  Avoid drinking a lot of liquid with meals.  Cook foods using methods other than frying. Bake, grill, or broil food instead.  Avoid or limit: ? Chocolate. ? Peppermint or spearmint. ? Alcohol. ? Pepper. ? Black and decaffeinated coffee. ? Black and decaffeinated tea. ? Bubbly (carbonated) soft drinks. ? Caffeinated energy drinks and soft drinks.  Limit high-fat foods such as: ? Fatty meat or fried foods. ? Whole milk, cream, butter, or ice cream. ? Nuts and nut butters. ? Pastries, donuts, and sweets made with butter or shortening.  Avoid foods that cause symptoms. These foods may be different for everyone. Common foods that cause symptoms include: ? Tomatoes. ? Oranges, lemons, and limes. ? Peppers. ? Spicy food. ? Onions and garlic. ? Vinegar. Lifestyle  Maintain a healthy weight. Ask your doctor what weight is healthy for you. If you need to lose weight, work with your doctor to do so safely.  Exercise for at least 30 minutes for 5 or more days each week, or as told by your doctor.  Wear loose-fitting clothes.  Do not smoke. If you need help quitting, ask your doctor.  Sleep with the head of your bed higher than your  feet. Use a wedge under the mattress or blocks under the bed frame to raise the head of the bed. Summary  When you have gastroesophageal reflux disease (GERD), food and lifestyle choices are very important in easing your symptoms.  Eat small meals often instead of 3 large meals a day. Eat your meals slowly, and in a place where you are relaxed.  Limit high-fat foods such as fatty meat or fried foods.  Avoid bending over or lying down until 2-3 hours after eating.  Avoid peppermint and spearmint, caffeine, alcohol, and chocolate. This information is not intended to replace advice given to you by your health care provider. Make sure you discuss any questions you have with your health care provider. Document Revised: 09/16/2018 Document Reviewed: 07/01/2016 Elsevier Patient Education  2020 Elsevier Inc. Influenza (Flu) Vaccine (Inactivated or Recombinant): What You Need to Know 1. Why get vaccinated? Influenza vaccine can prevent influenza (flu). Flu is a contagious disease that spreads around the [place] every year, usually between October and May. Anyone can get the flu, but it is more dangerous for some people. Infants and young children, people 58 years of age and older, pregnant women, and people with certain health conditions or a weakened immune system are at greatest risk of flu complications. Pneumonia, bronchitis, sinus infections and ear infections are examples of flu-related complications. If you have a medical condition, such as heart disease, cancer or diabetes, flu can make it worse. Flu can cause fever and chills, sore throat, muscle aches, fatigue, cough,  headache, and runny or stuffy nose. Some people may have vomiting and diarrhea, though this is more common in children than adults. Each year thousands of people in the [place] die from flu, and many more are hospitalized. Flu vaccine prevents millions of illnesses and flu-related visits to the doctor each year. 2.  Influenza vaccine CDC recommends everyone 21 months of age and older get vaccinated every flu season. Children 6 months through 49 years of age may need 2 doses during a single flu season. Everyone else needs only 1 dose each flu season. It takes about 2 weeks for protection to develop after vaccination. There are many flu viruses, and they are always changing. Each year a new flu vaccine is made to protect against three or four viruses that are likely to cause disease in the upcoming flu season. Even when the vaccine doesn't exactly match these viruses, it may still provide some protection. Influenza vaccine does not cause flu. Influenza vaccine may be given at the same time as other vaccines. 3. Talk with your health care provider Tell your vaccine provider if the person getting the vaccine:  Has had an allergic reaction after a previous dose of influenza vaccine, or has any severe, life-threatening allergies.  Has ever had Guillain-Barr Syndrome (also called GBS). In some cases, your health care provider may decide to postpone influenza vaccination to a future visit. People with minor illnesses, such as a cold, may be vaccinated. People who are moderately or severely ill should usually wait until they recover before getting influenza vaccine. Your health care provider can give you more information. 4. Risks of a vaccine reaction  Soreness, redness, and swelling where shot is given, fever, muscle aches, and headache can happen after influenza vaccine.  There may be a very small increased risk of Guillain-Barr Syndrome (GBS) after inactivated influenza vaccine (the flu shot). Young children who get the flu shot along with pneumococcal vaccine (PCV13), and/or DTaP vaccine at the same time might be slightly more likely to have a seizure caused by fever. Tell your health care provider if a child who is getting flu vaccine has ever had a seizure. People sometimes faint after medical procedures,  including vaccination. Tell your provider if you feel dizzy or have vision changes or ringing in the ears. As with any medicine, there is a very remote chance of a vaccine causing a severe allergic reaction, other serious injury, or death. 5. What if there is a serious problem? An allergic reaction could occur after the vaccinated person leaves the clinic. If you see signs of a severe allergic reaction (hives, swelling of the face and throat, difficulty breathing, a fast heartbeat, dizziness, or weakness), call 9-1-1 and get the person to the nearest hospital. For other signs that concern you, call your health care provider. Adverse reactions should be reported to the Vaccine Adverse Event Reporting System (VAERS). Your health care provider will usually file this report, or you can do it yourself. Visit the VAERS website at www.vaers.LAgents.no or call 815-077-0327.VAERS is only for reporting reactions, and VAERS staff do not give medical advice. 6. The National Vaccine Injury Compensation Program The [employer] Vaccine Injury Compensation Program (VICP) is a federal program that was created to compensate people who may have been injured by certain vaccines. Visit the VICP website at SpiritualWord.at or call 365-178-5249 to learn about the program and about filing a claim. There is a time limit to file a claim for compensation. 7. How can I learn  more?  Ask your healthcare provider.  Call your local or state health department.  Contact the Centers for Disease Control and Prevention (CDC): ? Call 706-210-7666 (1-800-CDC-INFO) or ? Visit CDC's https://gibson.com/ Vaccine Information Statement (Interim) Inactivated Influenza Vaccine (01/21/2018) This information is not intended to replace advice given to you by your health care provider. Make sure you discuss any questions you have with your health care provider. Document Revised: 09/14/2018 Document Reviewed: 01/25/2018 Elsevier Patient  Education  [place].

## 2019-06-13 NOTE — Assessment & Plan Note (Signed)
Recheck alk phos and GT today.  Consider u/s in future if continued elevations or symptoms present.

## 2019-06-13 NOTE — Assessment & Plan Note (Signed)
Chronic, ongoing.  Thyroid panel today.  Continue current medication regimen and adjust as needed based on labs.

## 2019-06-14 LAB — COMPREHENSIVE METABOLIC PANEL
ALT: 30 IU/L (ref 0–32)
AST: 27 IU/L (ref 0–40)
Albumin/Globulin Ratio: 1.5 (ref 1.2–2.2)
Albumin: 4.1 g/dL (ref 3.8–4.9)
Alkaline Phosphatase: 168 IU/L — ABNORMAL HIGH (ref 39–117)
BUN/Creatinine Ratio: 9 (ref 9–23)
BUN: 10 mg/dL (ref 6–24)
Bilirubin Total: 0.7 mg/dL (ref 0.0–1.2)
CO2: 25 mmol/L (ref 20–29)
Calcium: 9.2 mg/dL (ref 8.7–10.2)
Chloride: 104 mmol/L (ref 96–106)
Creatinine, Ser: 1.06 mg/dL — ABNORMAL HIGH (ref 0.57–1.00)
GFR calc Af Amer: 67 mL/min/{1.73_m2} (ref >59)
GFR calc non Af Amer: 58 mL/min/{1.73_m2} — ABNORMAL LOW (ref >59)
Globulin, Total: 2.7 g/dL (ref 1.5–4.5)
Glucose: 87 mg/dL (ref 65–99)
Potassium: 4.4 mmol/L (ref 3.5–5.2)
Sodium: 143 mmol/L (ref 134–144)
Total Protein: 6.8 g/dL (ref 6.0–8.5)

## 2019-06-14 LAB — CBC WITH DIFFERENTIAL/PLATELET
Basophils Absolute: 0 10*3/uL (ref 0.0–0.2)
Basos: 0 %
EOS (ABSOLUTE): 0.1 10*3/uL (ref 0.0–0.4)
Eos: 1 %
Hematocrit: 46.1 % (ref 34.0–46.6)
Hemoglobin: 14.8 g/dL (ref 11.1–15.9)
Immature Grans (Abs): 0.1 10*3/uL (ref 0.0–0.1)
Immature Granulocytes: 1 %
Lymphocytes Absolute: 1.1 10*3/uL (ref 0.7–3.1)
Lymphs: 13 %
MCH: 28 pg (ref 26.6–33.0)
MCHC: 32.1 g/dL (ref 31.5–35.7)
MCV: 87 fL (ref 79–97)
Monocytes Absolute: 0.5 10*3/uL (ref 0.1–0.9)
Monocytes: 7 %
Neutrophils Absolute: 6.4 10*3/uL (ref 1.4–7.0)
Neutrophils: 78 %
Platelets: 269 10*3/uL (ref 150–450)
RBC: 5.29 x10E6/uL — ABNORMAL HIGH (ref 3.77–5.28)
RDW: 12.6 % (ref 11.7–15.4)
WBC: 8.2 10*3/uL (ref 3.4–10.8)

## 2019-06-14 LAB — THYROID PANEL WITH TSH
Free Thyroxine Index: 3.3 (ref 1.2–4.9)
T3 Uptake Ratio: 29 % (ref 24–39)
T4, Total: 11.4 ug/dL (ref 4.5–12.0)
TSH: 3.75 u[IU]/mL (ref 0.450–4.500)

## 2019-06-14 LAB — GAMMA GT: GGT: 123 IU/L — ABNORMAL HIGH (ref 0–60)

## 2019-06-14 LAB — LIPID PANEL W/O CHOL/HDL RATIO
Cholesterol, Total: 157 mg/dL (ref 100–199)
HDL: 41 mg/dL (ref >39)
LDL Chol Calc (NIH): 99 mg/dL (ref 0–99)
Triglycerides: 89 mg/dL (ref 0–149)
VLDL Cholesterol Cal: 17 mg/dL (ref 5–40)

## 2019-06-14 LAB — VITAMIN D 25 HYDROXY (VIT D DEFICIENCY, FRACTURES): Vit D, 25-Hydroxy: 37.4 ng/mL (ref 30.0–100.0)

## 2019-06-14 NOTE — Progress Notes (Signed)
Please let Kristen Larson know following: - Thyroid testing is normal and can continue current medication dose. - Liver function tests remain at baseline, suspect those elevations in alkaline phos are coming from past gallbladder removal and liver.  Continue focus on healthy diet and weight. - She has no anemia and Vitamin D level is normal + cholesterol levels look good. - Did have mild elevation in creatinine, a kidney test, on this visit.  Please focus on ensuring good water intake daily.  Will recheck next visit. If any questions let me know.  Have a great day!!

## 2019-08-16 ENCOUNTER — Telehealth: Payer: Self-pay

## 2019-08-16 ENCOUNTER — Ambulatory Visit (INDEPENDENT_AMBULATORY_CARE_PROVIDER_SITE_OTHER): Payer: 59 | Admitting: Nurse Practitioner

## 2019-08-16 ENCOUNTER — Other Ambulatory Visit: Payer: Self-pay

## 2019-08-16 ENCOUNTER — Encounter: Payer: Self-pay | Admitting: Nurse Practitioner

## 2019-08-16 VITALS — BP 129/81 | HR 78 | Temp 98.5°F

## 2019-08-16 DIAGNOSIS — M10071 Idiopathic gout, right ankle and foot: Secondary | ICD-10-CM | POA: Insufficient documentation

## 2019-08-16 DIAGNOSIS — M1A00X0 Idiopathic chronic gout, unspecified site, without tophus (tophi): Secondary | ICD-10-CM | POA: Insufficient documentation

## 2019-08-16 DIAGNOSIS — M109 Gout, unspecified: Secondary | ICD-10-CM

## 2019-08-16 DIAGNOSIS — M1A9XX0 Chronic gout, unspecified, without tophus (tophi): Secondary | ICD-10-CM | POA: Insufficient documentation

## 2019-08-16 MED ORDER — PREDNISONE 10 MG PO TABS: ORAL_TABLET | ORAL | 0 refills | Status: DC

## 2019-08-16 NOTE — Patient Instructions (Signed)
Low-Purine Eating Plan A low-purine eating plan involves making food choices to limit your intake of purine. Purine is a kind of uric acid. Too much uric acid in your blood can cause certain conditions, such as gout and kidney stones. Eating a low-purine diet can help control these conditions. What are tips for following this plan? Reading food labels   Avoid foods with saturated or Trans fat.  Check the ingredient list of grains-based foods, such as bread and cereal, to make sure that they contain whole grains.  Check the ingredient list of sauces or soups to make sure they do not contain meat or fish.  When choosing soft drinks, check the ingredient list to make sure they do not contain high-fructose corn syrup. Shopping  Buy plenty of fresh fruits and vegetables.  Avoid buying canned or fresh fish.  Buy dairy products labeled as low-fat or nonfat.  Avoid buying premade or processed foods. These foods are often high in fat, salt (sodium), and added sugar. Cooking  Use olive oil instead of butter when cooking. Oils like olive oil, canola oil, and sunflower oil contain healthy fats. Meal planning  Learn which foods do or do not affect you. If you find out that a food tends to cause your gout symptoms to flare up, avoid eating that food. You can enjoy foods that do not cause problems. If you have any questions about a food item, talk with your dietitian or health care provider.  Limit foods high in fat, especially saturated fat. Fat makes it harder for your body to get rid of uric acid.  Choose foods that are lower in fat and are lean sources of protein. General guidelines  Limit alcohol intake to no more than 1 drink a day for nonpregnant women and 2 drinks a day for men. One drink equals 12 oz of beer, 5 oz of wine, or 1 oz of hard liquor. Alcohol can affect the way your body gets rid of uric acid.  Drink plenty of water to keep your urine clear or pale yellow. Fluids can help  remove uric acid from your body.  If directed by your health care provider, take a vitamin C supplement.  Work with your health care provider and dietitian to develop a plan to achieve or maintain a healthy weight. Losing weight can help reduce uric acid in your blood. What foods are recommended? The items listed may not be a complete list. Talk with your dietitian about what dietary choices are best for you. Foods low in purines Foods low in purines do not need to be limited. These include:  All fruits.  All low-purine vegetables, pickles, and olives.  Breads, pasta, rice, cornbread, and popcorn. Cake and other baked goods.  All dairy foods.  Eggs, nuts, and nut butters.  Spices and condiments, such as salt, herbs, and vinegar.  Plant oils, butter, and margarine.  Water, sugar-free soft drinks, tea, coffee, and cocoa.  Vegetable-based soups, broths, sauces, and gravies. Foods moderate in purines Foods moderate in purines should be limited to the amounts listed.   cup of asparagus, cauliflower, spinach, mushrooms, or green peas, each day.  2/3 cup uncooked oatmeal, each day.   cup dry wheat bran or wheat germ, each day.  2-3 ounces of meat or poultry, each day.  4-6 ounces of shellfish, such as crab, lobster, oysters, or shrimp, each day.  1 cup cooked beans, peas, or lentils, each day.  Soup, broths, or bouillon made from meat or   fish. Limit these foods as much as possible. What foods are not recommended? The items listed may not be a complete list. Talk with your dietitian about what dietary choices are best for you. Limit your intake of foods high in purines, including:  Beer and other alcohol.  Meat-based gravy or sauce.  Canned or fresh fish, such as: ? Anchovies, sardines, herring, and tuna. ? Mussels and scallops. ? Codfish, trout, and haddock.  Bacon.  Organ meats, such as: ? Liver or kidney. ? Tripe. ? Sweetbreads (thymus gland or  pancreas).  Wild game or goose.  Yeast or yeast extract supplements.  Drinks sweetened with high-fructose corn syrup. Summary  Eating a low-purine diet can help control conditions caused by too much uric acid in the body, such as gout or kidney stones.  Choose low-purine foods, limit alcohol, and limit foods high in fat.  You will learn over time which foods do or do not affect you. If you find out that a food tends to cause your gout symptoms to flare up, avoid eating that food. This information is not intended to replace advice given to you by your health care provider. Make sure you discuss any questions you have with your health care provider. Document Revised: 05/08/2017 Document Reviewed: 07/09/2016 Elsevier Patient Education  2020 [employer] Gout  Gout is painful swelling of your joints. Gout is a type of arthritis. It is caused by having too much uric acid in your body. Uric acid is a chemical that is made when your body breaks down substances called purines. If your body has too much uric acid, sharp crystals can form and build up in your joints. This causes pain and swelling. Gout attacks can happen quickly and be very painful (acute gout). Over time, the attacks can affect more joints and happen more often (chronic gout). What are the causes?  Too much uric acid in your blood. This can happen because: ? Your kidneys do not remove enough uric acid from your blood. ? Your body makes too much uric acid. ? You eat too many foods that are high in purines. These foods include organ meats, some seafood, and beer.  Trauma or stress. What increases the risk?  Having a family history of gout.  Being female and middle-aged.  Being female and having gone through menopause.  Being very overweight (obese).  Drinking alcohol, especially beer.  Not having enough water in the body (being dehydrated).  Losing weight too quickly.  Having an organ transplant.  Having lead  poisoning.  Taking certain medicines.  Having kidney disease.  Having a skin condition called psoriasis. What are the signs or symptoms? An attack of acute gout usually happens in just one joint. The most common place is the big toe. Attacks often start at night. Other joints that may be affected include joints of the feet, ankle, knee, fingers, wrist, or elbow. Symptoms of an attack may include:  Very bad pain.  Warmth.  Swelling.  Stiffness.  Shiny, red, or purple skin.  Tenderness. The affected joint may be very painful to touch.  Chills and fever. Chronic gout may cause symptoms more often. More joints may be involved. You may also have white or yellow lumps (tophi) on your hands or feet or in other areas near your joints. How is this treated?  Treatment for this condition has two phases: treating an acute attack and preventing future attacks.  Acute gout treatment may include: ? NSAIDs. ? Steroids. These are   taken by mouth or injected into a joint. ? Colchicine. This medicine relieves pain and swelling. It can be given by mouth or through an IV tube.  Preventive treatment may include: ? Taking small doses of NSAIDs or colchicine daily. ? Using a medicine that reduces uric acid levels in your blood. ? Making changes to your diet. You may need to see a food expert (dietitian) about what to eat and drink to prevent gout. Follow these instructions at home: During a gout attack   If told, put ice on the painful area: ? Put ice in a plastic bag. ? Place a towel between your skin and the bag. ? Leave the ice on for 20 minutes, 2-3 times a day.  Raise (elevate) the painful joint above the level of your heart as often as you can.  Rest the joint as much as possible. If the joint is in your leg, you may be given crutches.  Follow instructions from your doctor about what you cannot eat or drink. Avoiding future gout attacks  Eat a low-purine diet. Avoid foods and drinks  such as: ? Liver. ? Kidney. ? Anchovies. ? Asparagus. ? Herring. ? Mushrooms. ? Mussels. ? Beer.  Stay at a healthy weight. If you want to lose weight, talk with your doctor. Do not lose weight too fast.  Start or continue an exercise plan as told by your doctor. Eating and drinking  Drink enough fluids to keep your pee (urine) pale yellow.  If you drink alcohol: ? Limit how much you use to:  0-1 drink a day for women.  0-2 drinks a day for men. ? Be aware of how much alcohol is in your drink. In the [place] one drink equals one 12 oz bottle of beer (355 mL), one 5 oz glass of wine (148 mL), or one 1 oz glass of hard liquor (44 mL). General instructions  Take over-the-counter and prescription medicines only as told by your doctor.  Do not drive or use heavy machinery while taking prescription pain medicine.  Return to your normal activities as told by your doctor. Ask your doctor what activities are safe for you.  Keep all follow-up visits as told by your doctor. This is important. Contact a doctor if:  You have another gout attack.  You still have symptoms of a gout attack after 10 days of treatment.  You have problems (side effects) because of your medicines.  You have chills or a fever.  You have burning pain when you pee (urinate).  You have pain in your lower back or belly. Get help right away if:  You have very bad pain.  Your pain cannot be controlled.  You cannot pee. Summary  Gout is painful swelling of the joints.  The most common site of pain is the big toe, but it can affect other joints.  Medicines and avoiding some foods can help to prevent and treat gout attacks. This information is not intended to replace advice given to you by your health care provider. Make sure you discuss any questions you have with your health care provider. Document Revised: 12/16/2017 Document Reviewed: 12/16/2017 Elsevier Patient Education  2020 [employer]  

## 2019-08-16 NOTE — Progress Notes (Signed)
BP 129/81 (BP Location: Left Arm, Patient Position: Sitting, Cuff Size: Normal)   Pulse 78   Temp 98.5 F (36.9 C) (Oral)   LMP  (LMP Unknown)   SpO2 97%    Subjective:    Patient ID: Kristen Larson, female    DOB: [birth year], 59 y.o.   MRN: [id number]  HPI: Kristen Larson is a 59 y.o. female  Chief Complaint  Patient presents with  . Gout   GOUT Started end of last week.  Last flare was two years, took Prednisone and this went away.  That time was in left great toe. Duration:weeks Right 1st metatarsophalangeal pain: yes Left 1st metatarsophalangeal pain: no Right knee pain: no Left knee pain: no Severity: 8/10  Quality: dull, aching and burning Swelling: yes Redness: yes Trauma: no Recent dietary change or indiscretion: not that she recalls Fevers: no Nausea/vomiting: no Aggravating factors: walking on area Alleviating factors: nothing Status:  stable Treatments attempted: nothing  Relevant past medical, surgical, family and social history reviewed and updated as indicated. Interim medical history since our last visit reviewed. Allergies and medications reviewed and updated.  Review of Systems  Constitutional: Negative for activity change, appetite change, diaphoresis, fatigue and fever.  Respiratory: Negative for cough, chest tightness and shortness of breath.   Cardiovascular: Negative for chest pain, palpitations and leg swelling.  Musculoskeletal: Positive for joint swelling.  Neurological: Negative.   Psychiatric/Behavioral: Negative.     Per HPI unless specifically indicated above     Objective:    BP 129/81 (BP Location: Left Arm, Patient Position: Sitting, Cuff Size: Normal)   Pulse 78   Temp 98.5 F (36.9 C) (Oral)   LMP  (LMP Unknown)   SpO2 97%   Wt Readings from Last 3 Encounters:  06/13/19 167 lb 3.2 oz (75.8 kg)  11/08/18 168 lb (76.2 kg)  04/28/18 168 lb (76.2 kg)    Physical Exam Vitals and nursing note reviewed.   Constitutional:      General: She is awake. She is not in acute distress.    Appearance: She is well-developed and overweight. She is not ill-appearing.  HENT:     Head: Normocephalic.     Right Ear: Hearing normal.     Left Ear: Hearing normal.  Eyes:     General: Lids are normal.        Right eye: No discharge.        Left eye: No discharge.     Conjunctiva/sclera: Conjunctivae normal.     Pupils: Pupils are equal, round, and reactive to light.  Neck:     Vascular: No carotid bruit.  Cardiovascular:     Rate and Rhythm: Normal rate and regular rhythm.     Pulses:          Dorsalis pedis pulses are 2+ on the right side and 2+ on the left side.       Posterior tibial pulses are 2+ on the right side and 2+ on the left side.     Heart sounds: Normal heart sounds. No murmur. No gallop.   Pulmonary:     Effort: Pulmonary effort is normal. No accessory muscle usage or respiratory distress.     Breath sounds: Normal breath sounds.  Abdominal:     General: Bowel sounds are normal.     Palpations: Abdomen is soft.  Musculoskeletal:     Cervical back: Normal range of motion and neck supple.     Right lower leg: No  edema.     Left lower leg: No edema.     Right foot: Normal range of motion.     Left foot: Normal range of motion.       Feet:  Feet:     Right foot:     Skin integrity: Erythema (to lateral aspect) and warmth present.     Toenail Condition: Right toenails are normal.     Left foot:     Skin integrity: Skin integrity normal.     Toenail Condition: Left toenails are normal.  Skin:    General: Skin is warm and dry.  Neurological:     Mental Status: She is alert and oriented to person, place, and time.  Psychiatric:        Attention and Perception: Attention normal.        Mood and Affect: Mood normal.        Speech: Speech normal.        Behavior: Behavior normal. Behavior is cooperative.     Results for orders placed or performed in visit on 06/13/19  Thyroid  Panel With TSH  Result Value Ref Range   TSH 3.750 0.450 - 4.500 uIU/mL   T4, Total 11.4 4.5 - 12.0 ug/dL   T3 Uptake Ratio 29 24 - 39 %   Free Thyroxine Index 3.3 1.2 - 4.9  CBC with Differential/Platelet out  Result Value Ref Range   WBC 8.2 3.4 - 10.8 x10E3/uL   RBC 5.29 (H) 3.77 - 5.28 x10E6/uL   Hemoglobin 14.8 11.1 - 15.9 g/dL   Hematocrit 23.5 57.3 - 46.6 %   MCV 87 79 - 97 fL   MCH 28.0 26.6 - 33.0 pg   MCHC 32.1 31.5 - 35.7 g/dL   RDW 22.0 25.4 - 27.0 %   Platelets 269 150 - 450 x10E3/uL   Neutrophils 78 Not Estab. %   Lymphs 13 Not Estab. %   Monocytes 7 Not Estab. %   Eos 1 Not Estab. %   Basos 0 Not Estab. %   Neutrophils Absolute 6.4 1.4 - 7.0 x10E3/uL   Lymphocytes Absolute 1.1 0.7 - 3.1 x10E3/uL   Monocytes Absolute 0.5 0.1 - 0.9 x10E3/uL   EOS (ABSOLUTE) 0.1 0.0 - 0.4 x10E3/uL   Basophils Absolute 0.0 0.0 - 0.2 x10E3/uL   Immature Granulocytes 1 Not Estab. %   Immature Grans (Abs) 0.1 0.0 - 0.1 x10E3/uL  Comprehensive metabolic panel  Result Value Ref Range   Glucose 87 65 - 99 mg/dL   BUN 10 6 - 24 mg/dL   Creatinine, Ser 6.23 (H) 0.57 - 1.00 mg/dL   GFR calc non Af Amer 58 (L) >59 mL/min/1.73   GFR calc Af Amer 67 >59 mL/min/1.73   BUN/Creatinine Ratio 9 9 - 23   Sodium 143 134 - 144 mmol/L   Potassium 4.4 3.5 - 5.2 mmol/L   Chloride 104 96 - 106 mmol/L   CO2 25 20 - 29 mmol/L   Calcium 9.2 8.7 - 10.2 mg/dL   Total Protein 6.8 6.0 - 8.5 g/dL   Albumin 4.1 3.8 - 4.9 g/dL   Globulin, Total 2.7 1.5 - 4.5 g/dL   Albumin/Globulin Ratio 1.5 1.2 - 2.2   Bilirubin Total 0.7 0.0 - 1.2 mg/dL   Alkaline Phosphatase 168 (H) 39 - 117 IU/L   AST 27 0 - 40 IU/L   ALT 30 0 - 32 IU/L  Lipid Panel w/o Chol/HDL Ratio out  Result Value Ref Range   Cholesterol,  Total 157 100 - 199 mg/dL   Triglycerides 89 0 - 149 mg/dL   HDL 41 >39 mg/dL   VLDL Cholesterol Cal 17 5 - 40 mg/dL   LDL Chol Calc (NIH) 99 0 - 99 mg/dL  Vit D  25 hydroxy (rtn osteoporosis monitoring)   Result Value Ref Range   Vit D, 25-Hydroxy 37.4 30.0 - 100.0 ng/mL  Gamma GT  Result Value Ref Range   GGT 123 (H) 0 - 60 IU/L      Assessment & Plan:   Problem List Items Addressed This Visit      Musculoskeletal and Integument   Acute gout of right foot - Primary    Acute for one week.  Obtain labs today to include CBC, CMP, uric acid.  Send in Prednisone taper.  Recommend monitoring diet and low purine diet focus at home.  Could consider addition of Allopurinol if ongoing flares or uric acid elevation present.  Return for worsening or ongoing symptoms.      Relevant Medications   predniSONE (DELTASONE) 10 MG tablet   Other Relevant Orders   Uric acid   CBC with Differential/Platelet   Comprehensive metabolic panel       Follow up plan: Return if symptoms worsen or fail to improve.

## 2019-08-16 NOTE — Telephone Encounter (Signed)
Called and left a message for patient to return my call.   Copied from CRM [id number]. Topic: General - Other >> Aug 15, 2019  9:51 AM Dalphine Handing A wrote: Patient would like a callback from nurse to speak about her gout. >> Aug 15, 2019  4:02 PM Pablo Ledger, CMA wrote: Called and LVM asking for patient to please return my call.

## 2019-08-16 NOTE — Assessment & Plan Note (Signed)
Acute for one week.  Obtain labs today to include CBC, CMP, uric acid.  Send in Prednisone taper.  Recommend monitoring diet and low purine diet focus at home.  Could consider addition of Allopurinol if ongoing flares or uric acid elevation present.  Return for worsening or ongoing symptoms.

## 2019-08-16 NOTE — Telephone Encounter (Signed)
Coming in this afternoon for an appointment.

## 2019-08-17 LAB — COMPREHENSIVE METABOLIC PANEL
ALT: 21 IU/L (ref 0–32)
AST: 21 IU/L (ref 0–40)
Albumin/Globulin Ratio: 1.7 (ref 1.2–2.2)
Albumin: 4.7 g/dL (ref 3.8–4.9)
Alkaline Phosphatase: 166 IU/L — ABNORMAL HIGH (ref 39–117)
BUN/Creatinine Ratio: 14 (ref 9–23)
BUN: 15 mg/dL (ref 6–24)
Bilirubin Total: 0.7 mg/dL (ref 0.0–1.2)
CO2: 25 mmol/L (ref 20–29)
Calcium: 10 mg/dL (ref 8.7–10.2)
Chloride: 102 mmol/L (ref 96–106)
Creatinine, Ser: 1.08 mg/dL — ABNORMAL HIGH (ref 0.57–1.00)
GFR calc Af Amer: 66 mL/min/{1.73_m2} (ref >59)
GFR calc non Af Amer: 57 mL/min/{1.73_m2} — ABNORMAL LOW (ref >59)
Globulin, Total: 2.7 g/dL (ref 1.5–4.5)
Glucose: 77 mg/dL (ref 65–99)
Potassium: 4.6 mmol/L (ref 3.5–5.2)
Sodium: 143 mmol/L (ref 134–144)
Total Protein: 7.4 g/dL (ref 6.0–8.5)

## 2019-08-17 LAB — CBC WITH DIFFERENTIAL/PLATELET
Basophils Absolute: 0 10*3/uL (ref 0.0–0.2)
Basos: 1 %
EOS (ABSOLUTE): 0.1 10*3/uL (ref 0.0–0.4)
Eos: 1 %
Hematocrit: 45.9 % (ref 34.0–46.6)
Hemoglobin: 15.4 g/dL (ref 11.1–15.9)
Immature Grans (Abs): 0 10*3/uL (ref 0.0–0.1)
Immature Granulocytes: 1 %
Lymphocytes Absolute: 1.4 10*3/uL (ref 0.7–3.1)
Lymphs: 18 %
MCH: 29.3 pg (ref 26.6–33.0)
MCHC: 33.6 g/dL (ref 31.5–35.7)
MCV: 87 fL (ref 79–97)
Monocytes Absolute: 0.5 10*3/uL (ref 0.1–0.9)
Monocytes: 6 %
Neutrophils Absolute: 5.7 10*3/uL (ref 1.4–7.0)
Neutrophils: 73 %
Platelets: 231 10*3/uL (ref 150–450)
RBC: 5.26 x10E6/uL (ref 3.77–5.28)
RDW: 12.5 % (ref 11.7–15.4)
WBC: 7.8 10*3/uL (ref 3.4–10.8)

## 2019-08-17 LAB — URIC ACID: Uric Acid: 7 mg/dL (ref 3.0–7.2)

## 2019-08-17 NOTE — Progress Notes (Signed)
Please let Irine know her labs returned.  Slight elevation in uric acid, hopefully Prednisone is starting to help with flare.  CBC is normal, no anemia or infection.  Kidney function continues to show some mild kidney disease, no worsening.  This is good.  All labs remain at baseline.  Will continue to monitor and recheck next visit.  Have a good day!!

## 2019-08-18 ENCOUNTER — Ambulatory Visit: Payer: Self-pay

## 2019-08-18 NOTE — Telephone Encounter (Signed)
Incoming call from Patient .  Related lab results of Aura Dials 08/17/19 Patient voiced understanding.

## 2019-12-19 ENCOUNTER — Ambulatory Visit: Payer: 59 | Admitting: Nurse Practitioner

## 2019-12-23 ENCOUNTER — Other Ambulatory Visit: Payer: Self-pay

## 2019-12-23 ENCOUNTER — Other Ambulatory Visit: Payer: Self-pay | Admitting: Nurse Practitioner

## 2019-12-23 ENCOUNTER — Ambulatory Visit (INDEPENDENT_AMBULATORY_CARE_PROVIDER_SITE_OTHER): Payer: 59 | Admitting: Nurse Practitioner

## 2019-12-23 ENCOUNTER — Ambulatory Visit: Payer: 59 | Admitting: Nurse Practitioner

## 2019-12-23 ENCOUNTER — Encounter: Payer: Self-pay | Admitting: Nurse Practitioner

## 2019-12-23 VITALS — BP 107/68 | HR 72 | Temp 98.4°F | Wt 166.0 lb

## 2019-12-23 DIAGNOSIS — Z6830 Body mass index (BMI) 30.0-30.9, adult: Secondary | ICD-10-CM

## 2019-12-23 DIAGNOSIS — Z6829 Body mass index (BMI) 29.0-29.9, adult: Secondary | ICD-10-CM | POA: Insufficient documentation

## 2019-12-23 DIAGNOSIS — I10 Essential (primary) hypertension: Secondary | ICD-10-CM | POA: Diagnosis not present

## 2019-12-23 DIAGNOSIS — E039 Hypothyroidism, unspecified: Secondary | ICD-10-CM

## 2019-12-23 DIAGNOSIS — Z6828 Body mass index (BMI) 28.0-28.9, adult: Secondary | ICD-10-CM | POA: Insufficient documentation

## 2019-12-23 DIAGNOSIS — R7301 Impaired fasting glucose: Secondary | ICD-10-CM

## 2019-12-23 DIAGNOSIS — E6609 Other obesity due to excess calories: Secondary | ICD-10-CM | POA: Diagnosis not present

## 2019-12-23 DIAGNOSIS — Z1231 Encounter for screening mammogram for malignant neoplasm of breast: Secondary | ICD-10-CM

## 2019-12-23 NOTE — Progress Notes (Signed)
BP 107/68   Pulse 72   Temp 98.4 F (36.9 C) (Oral)   Wt 166 lb (75.3 kg)   LMP  (LMP Unknown)   SpO2 97%   BMI 30.94 kg/m    Subjective:    Patient ID: Kristen Larson, female    DOB: 25-Apr-1961, 59 y.o.   MRN: [id number]  HPI: Kristen Larson is a 59 y.o. female  Chief Complaint  Patient presents with  . Hypertension  . Hypothyroidism  . Paperwork    wellness paper for insurance   HYPERTENSION  Continues on Lisinopril 10 MG daily.  Has wellness papers for work to complete, all labs up to date, but needs A1C for form. Satisfied with current treatment? yes Duration of hypertension: chronic BP monitoring frequency: not checking BP range:  BP medication side effects: no Aspirin: yes Recent stressors: no Recurrent headaches: no Visual changes: no Palpitations: no Dyspnea: no Chest pain: no Lower extremity edema: no Dizzy/lightheaded: no   HYPOTHYROIDISM Continues on Levo 75 MCG with last TSH 3.750 in January. Thyroid control status:stable Satisfied with current treatment? yes Medication side effects: no Medication compliance: good compliance Etiology of hypothyroidism:  Recent dose adjustment:no Fatigue: no Cold intolerance: no Heat intolerance: no Weight gain: no Weight loss: no Constipation: no Diarrhea/loose stools: no Palpitations: no Lower extremity edema: no Anxiety/depressed mood: no   Relevant past medical, surgical, family and social history reviewed and updated as indicated. Interim medical history since our last visit reviewed. Allergies and medications reviewed and updated.  Review of Systems  Constitutional: Negative for activity change, appetite change, diaphoresis, fatigue and fever.  Respiratory: Negative for cough, chest tightness and shortness of breath.   Cardiovascular: Negative for chest pain, palpitations and leg swelling.  Gastrointestinal: Negative.   Endocrine: Negative for cold intolerance, heat intolerance, polydipsia,  polyphagia and polyuria.  Neurological: Negative.   Psychiatric/Behavioral: Negative.     Per HPI unless specifically indicated above     Objective:    BP 107/68   Pulse 72   Temp 98.4 F (36.9 C) (Oral)   Wt 166 lb (75.3 kg)   LMP  (LMP Unknown)   SpO2 97%   BMI 30.94 kg/m   Wt Readings from Last 3 Encounters:  12/23/19 166 lb (75.3 kg)  06/13/19 167 lb 3.2 oz (75.8 kg)  11/08/18 168 lb (76.2 kg)    Physical Exam Vitals and nursing note reviewed.  Constitutional:      General: She is awake. She is not in acute distress.    Appearance: She is well-developed and well-groomed. She is obese. She is not ill-appearing.  HENT:     Head: Normocephalic.     Right Ear: Hearing normal.     Left Ear: Hearing normal.  Eyes:     General: Lids are normal.        Right eye: No discharge.        Left eye: No discharge.     Conjunctiva/sclera: Conjunctivae normal.     Pupils: Pupils are equal, round, and reactive to light.  Neck:     Thyroid: No thyromegaly.     Vascular: No carotid bruit.  Cardiovascular:     Rate and Rhythm: Normal rate and regular rhythm.     Heart sounds: Normal heart sounds. No murmur heard.  No gallop.   Pulmonary:     Effort: Pulmonary effort is normal.     Breath sounds: Normal breath sounds.  Abdominal:     General: Bowel  sounds are normal.     Palpations: Abdomen is soft.  Musculoskeletal:     Cervical back: Normal range of motion and neck supple.     Right lower leg: No edema.     Left lower leg: No edema.  Lymphadenopathy:     Cervical: No cervical adenopathy.  Skin:    General: Skin is warm and dry.  Neurological:     Mental Status: She is alert and oriented to person, place, and time.  Psychiatric:        Attention and Perception: Attention normal.        Mood and Affect: Mood normal.        Speech: Speech normal.        Behavior: Behavior normal. Behavior is cooperative.        Thought Content: Thought content normal.     Results  for orders placed or performed in visit on 08/16/19  Uric acid  Result Value Ref Range   Uric Acid 7.0 3.0 - 7.2 mg/dL  CBC with Differential/Platelet  Result Value Ref Range   WBC 7.8 3.4 - 10.8 x10E3/uL   RBC 5.26 3.77 - 5.28 x10E6/uL   Hemoglobin 15.4 11.1 - 15.9 g/dL   Hematocrit 36.6 29.4 - 46.6 %   MCV 87 79 - 97 fL   MCH 29.3 26.6 - 33.0 pg   MCHC 33.6 31 - 35 g/dL   RDW 76.5 46.5 - 03.5 %   Platelets 231 150 - 450 x10E3/uL   Neutrophils 73 Not Estab. %   Lymphs 18 Not Estab. %   Monocytes 6 Not Estab. %   Eos 1 Not Estab. %   Basos 1 Not Estab. %   Neutrophils Absolute 5.7 1 - 7 x10E3/uL   Lymphocytes Absolute 1.4 0 - 3 x10E3/uL   Monocytes Absolute 0.5 0 - 0 x10E3/uL   EOS (ABSOLUTE) 0.1 0.0 - 0.4 x10E3/uL   Basophils Absolute 0.0 0 - 0 x10E3/uL   Immature Granulocytes 1 Not Estab. %   Immature Grans (Abs) 0.0 0.0 - 0.1 x10E3/uL  Comprehensive metabolic panel  Result Value Ref Range   Glucose 77 65 - 99 mg/dL   BUN 15 6 - 24 mg/dL   Creatinine, Ser 4.65 (H) 0.57 - 1.00 mg/dL   GFR calc non Af Amer 57 (L) >59 mL/min/1.73   GFR calc Af Amer 66 >59 mL/min/1.73   BUN/Creatinine Ratio 14 9 - 23   Sodium 143 134 - 144 mmol/L   Potassium 4.6 3.5 - 5.2 mmol/L   Chloride 102 96 - 106 mmol/L   CO2 25 20 - 29 mmol/L   Calcium 10.0 8.7 - 10.2 mg/dL   Total Protein 7.4 6.0 - 8.5 g/dL   Albumin 4.7 3.8 - 4.9 g/dL   Globulin, Total 2.7 1.5 - 4.5 g/dL   Albumin/Globulin Ratio 1.7 1.2 - 2.2   Bilirubin Total 0.7 0.0 - 1.2 mg/dL   Alkaline Phosphatase 166 (H) 39 - 117 IU/L   AST 21 0 - 40 IU/L   ALT 21 0 - 32 IU/L      Assessment & Plan:   Problem List Items Addressed This Visit      Cardiovascular and Mediastinum   Hypertension - Primary    Chronic, ongoing.  BP at goal today in office.  BMP today.  Continue current medication regimen and adjust as needed.  Recommend she monitor BP at lease weekly at home and document + focus on DASH diet.  Return in  6 months for  annual physical.      Relevant Orders   Basic metabolic panel     Endocrine   Hypothyroidism    Chronic, ongoing.  Thyroid panel next visit.  Continue current medication regimen and adjust as needed based on labs.          Other   Obesity    BMI 30.94.  Recommended eating smaller high protein, low fat meals more frequently and exercising 30 mins a day 5 times a week with a goal of 10-15lb weight loss in the next 3 months. Patient voiced their understanding and motivation to adhere to these recommendations.        Other Visit Diagnoses    IFG (impaired fasting glucose)       A1C obtained for work paperwork   Relevant Orders   HgB A1c       Follow up plan: Return in about 6 months (around 06/24/2020) for Annual physical.

## 2019-12-23 NOTE — Assessment & Plan Note (Signed)
Chronic, ongoing.  Thyroid panel next visit.  Continue current medication regimen and adjust as needed based on labs.

## 2019-12-23 NOTE — Patient Instructions (Signed)
DASH Eating Plan DASH stands for "Dietary Approaches to Stop Hypertension." The DASH eating plan is a healthy eating plan that has been shown to reduce high blood pressure (hypertension). It may also reduce your risk for type 2 diabetes, heart disease, and stroke. The DASH eating plan may also help with weight loss. What are tips for following this plan?  General guidelines  Avoid eating more than 2,300 mg (milligrams) of salt (sodium) a day. If you have hypertension, you may need to reduce your sodium intake to 1,500 mg a day.  Limit alcohol intake to no more than 1 drink a day for nonpregnant women and 2 drinks a day for men. One drink equals 12 oz of beer, 5 oz of wine, or 1 oz of hard liquor.  Work with your health care provider to maintain a healthy body weight or to lose weight. Ask what an ideal weight is for you.  Get at least 30 minutes of exercise that causes your heart to beat faster (aerobic exercise) most days of the week. Activities may include walking, swimming, or biking.  Work with your health care provider or diet and nutrition specialist (dietitian) to adjust your eating plan to your individual calorie needs. Reading food labels   Check food labels for the amount of sodium per serving. Choose foods with less than 5 percent of the Daily Value of sodium. Generally, foods with less than 300 mg of sodium per serving fit into this eating plan.  To find whole grains, look for the word "whole" as the first word in the ingredient list. Shopping  Buy products labeled as "low-sodium" or "no salt added."  Buy fresh foods. Avoid canned foods and premade or frozen meals. Cooking  Avoid adding salt when cooking. Use salt-free seasonings or herbs instead of table salt or sea salt. Check with your health care provider or pharmacist before using salt substitutes.  Do not fry foods. Cook foods using healthy methods such as baking, boiling, grilling, and broiling instead.  Cook with  heart-healthy oils, such as olive, canola, soybean, or sunflower oil. Meal planning  Eat a balanced diet that includes: ? 5 or more servings of fruits and vegetables each day. At each meal, try to fill half of your plate with fruits and vegetables. ? Up to 6-8 servings of whole grains each day. ? Less than 6 oz of lean meat, poultry, or fish each day. A 3-oz serving of meat is about the same size as a deck of cards. One egg equals 1 oz. ? 2 servings of low-fat dairy each day. ? A serving of nuts, seeds, or beans 5 times each week. ? Heart-healthy fats. Healthy fats called Omega-3 fatty acids are found in foods such as flaxseeds and coldwater fish, like sardines, salmon, and mackerel.  Limit how much you eat of the following: ? Canned or prepackaged foods. ? Food that is high in trans fat, such as fried foods. ? Food that is high in saturated fat, such as fatty meat. ? Sweets, desserts, sugary drinks, and other foods with added sugar. ? Full-fat dairy products.  Do not salt foods before eating.  Try to eat at least 2 vegetarian meals each week.  Eat more home-cooked food and less restaurant, buffet, and fast food.  When eating at a restaurant, ask that your food be prepared with less salt or no salt, if possible. What foods are recommended? The items listed may not be a complete list. Talk with your dietitian about   what dietary choices are best for you. Grains Whole-grain or whole-wheat bread. Whole-grain or whole-wheat pasta. [place] rice. Oatmeal. Quinoa. Bulgur. Whole-grain and low-sodium cereals. Pita bread. Low-fat, low-sodium crackers. Whole-wheat flour tortillas. Vegetables Fresh or frozen vegetables (raw, steamed, roasted, or grilled). Low-sodium or reduced-sodium tomato and vegetable juice. Low-sodium or reduced-sodium tomato sauce and tomato paste. Low-sodium or reduced-sodium canned vegetables. Fruits All fresh, dried, or frozen fruit. Canned fruit in natural juice (without  added sugar). Meat and other protein foods Skinless chicken or turkey. Ground chicken or turkey. Pork with fat trimmed off. Fish and seafood. Egg whites. Dried beans, peas, or lentils. Unsalted nuts, nut butters, and seeds. Unsalted canned beans. Lean cuts of beef with fat trimmed off. Low-sodium, lean deli meat. Dairy Low-fat (1%) or fat-free (skim) milk. Fat-free, low-fat, or reduced-fat cheeses. Nonfat, low-sodium ricotta or cottage cheese. Low-fat or nonfat yogurt. Low-fat, low-sodium cheese. Fats and oils Soft margarine without trans fats. Vegetable oil. Low-fat, reduced-fat, or light mayonnaise and salad dressings (reduced-sodium). Canola, safflower, olive, soybean, and sunflower oils. Avocado. Seasoning and other foods Herbs. Spices. Seasoning mixes without salt. Unsalted popcorn and pretzels. Fat-free sweets. What foods are not recommended? The items listed may not be a complete list. Talk with your dietitian about what dietary choices are best for you. Grains Baked goods made with fat, such as croissants, muffins, or some breads. Dry pasta or rice meal packs. Vegetables Creamed or fried vegetables. Vegetables in a cheese sauce. Regular canned vegetables (not low-sodium or reduced-sodium). Regular canned tomato sauce and paste (not low-sodium or reduced-sodium). Regular tomato and vegetable juice (not low-sodium or reduced-sodium). Pickles. Olives. Fruits Canned fruit in a light or heavy syrup. Fried fruit. Fruit in cream or butter sauce. Meat and other protein foods Fatty cuts of meat. Ribs. Fried meat. Bacon. Sausage. Bologna and other processed lunch meats. Salami. Fatback. Hotdogs. Bratwurst. Salted nuts and seeds. Canned beans with added salt. Canned or smoked fish. Whole eggs or egg yolks. Chicken or turkey with skin. Dairy Whole or 2% milk, cream, and half-and-half. Whole or full-fat cream cheese. Whole-fat or sweetened yogurt. Full-fat cheese. Nondairy creamers. Whipped toppings.  Processed cheese and cheese spreads. Fats and oils Butter. Stick margarine. Lard. Shortening. Ghee. Bacon fat. Tropical oils, such as coconut, palm kernel, or palm oil. Seasoning and other foods Salted popcorn and pretzels. Onion salt, garlic salt, seasoned salt, table salt, and sea salt. Worcestershire sauce. Tartar sauce. Barbecue sauce. Teriyaki sauce. Soy sauce, including reduced-sodium. Steak sauce. Canned and packaged gravies. Fish sauce. Oyster sauce. Cocktail sauce. Horseradish that you find on the shelf. Ketchup. Mustard. Meat flavorings and tenderizers. Bouillon cubes. Hot sauce and Tabasco sauce. Premade or packaged marinades. Premade or packaged taco seasonings. Relishes. Regular salad dressings. Where to find more information:  National Heart, Lung, and Blood Institute: www.nhlbi.nih.gov  American Heart Association: www.heart.org Summary  The DASH eating plan is a healthy eating plan that has been shown to reduce high blood pressure (hypertension). It may also reduce your risk for type 2 diabetes, heart disease, and stroke.  With the DASH eating plan, you should limit salt (sodium) intake to 2,300 mg a day. If you have hypertension, you may need to reduce your sodium intake to 1,500 mg a day.  When on the DASH eating plan, aim to eat more fresh fruits and vegetables, whole grains, lean proteins, low-fat dairy, and heart-healthy fats.  Work with your health care provider or diet and nutrition specialist (dietitian) to adjust your eating plan to your   individual calorie needs. This information is not intended to replace advice given to you by your health care provider. Make sure you discuss any questions you have with your health care provider. Document Revised: 05/08/2017 Document Reviewed: 05/19/2016 Elsevier Patient Education  2020 Elsevier Inc.  

## 2019-12-23 NOTE — Assessment & Plan Note (Signed)
Chronic, ongoing.  BP at goal today in office.  BMP today.  Continue current medication regimen and adjust as needed.  Recommend she monitor BP at lease weekly at home and document + focus on DASH diet.  Return in 6 months for annual physical.

## 2019-12-23 NOTE — Assessment & Plan Note (Signed)
BMI 30.94.  Recommended eating smaller high protein, low fat meals more frequently and exercising 30 mins a day 5 times a week with a goal of 10-15lb weight loss in the next 3 months. Patient voiced their understanding and motivation to adhere to these recommendations.  

## 2019-12-24 LAB — BASIC METABOLIC PANEL
BUN/Creatinine Ratio: 16 (ref 9–23)
BUN: 16 mg/dL (ref 6–24)
CO2: 23 mmol/L (ref 20–29)
Calcium: 9.5 mg/dL (ref 8.7–10.2)
Chloride: 104 mmol/L (ref 96–106)
Creatinine, Ser: 1 mg/dL (ref 0.57–1.00)
GFR calc Af Amer: 72 mL/min/{1.73_m2} (ref >59)
GFR calc non Af Amer: 62 mL/min/{1.73_m2} (ref >59)
Glucose: 83 mg/dL (ref 65–99)
Potassium: 4.7 mmol/L (ref 3.5–5.2)
Sodium: 142 mmol/L (ref 134–144)

## 2019-12-24 LAB — HEMOGLOBIN A1C
Est. average glucose Bld gHb Est-mCnc: 108 mg/dL
Hgb A1c MFr Bld: 5.4 % (ref 4.8–5.6)

## 2019-12-25 NOTE — Progress Notes (Signed)
Please let Kristen Larson know her labs have returned and kidney function remains stable.  Her A1C is 5.4%, showing no prediabetes or diabetes.  Great news!!  We will make sure to fax wellness papers.  Have a wonderful day!!

## 2019-12-26 ENCOUNTER — Ambulatory Visit: Payer: Self-pay | Admitting: *Deleted

## 2019-12-26 NOTE — Telephone Encounter (Signed)
Pt called in and was given the message pertaining to her lab results from Aura Dials, NP dated 12/25/2019 at 9:45 PM.  No questions.  She asked that we fax the wellness papers that Jolene mentioned in her note.

## 2020-01-20 ENCOUNTER — Other Ambulatory Visit: Payer: Self-pay | Admitting: Nurse Practitioner

## 2020-02-10 ENCOUNTER — Other Ambulatory Visit: Payer: Self-pay

## 2020-02-10 ENCOUNTER — Ambulatory Visit
Admission: RE | Admit: 2020-02-10 | Discharge: 2020-02-10 | Disposition: A | Discharge status: Home / Self Care | Payer: 59 | Source: Ambulatory Visit | Attending: Nurse Practitioner | Admitting: Nurse Practitioner

## 2020-02-10 DIAGNOSIS — Z1231 Encounter for screening mammogram for malignant neoplasm of breast: Secondary | ICD-10-CM | POA: Diagnosis not present

## 2020-03-30 ENCOUNTER — Telehealth: Payer: Self-pay

## 2020-03-30 NOTE — Telephone Encounter (Signed)
Form placed in providers folder for completion and signature.  °

## 2020-03-30 NOTE — Telephone Encounter (Signed)
Form completed and signed by Jolene. Emailed the form as requested to [email or web address]. Called and LVM letting patient know that this was done for her.

## 2020-03-30 NOTE — Telephone Encounter (Signed)
Pt is here dropping [employer] wellness screening appeal form stated it was due today. Left in bin to be reviewed. Pt would like a call when done.

## 2020-04-02 NOTE — Telephone Encounter (Signed)
Pt called to thank the office for completing this, she also states she be coming by in the afternoon to retrieve documents.

## 2020-05-31 ENCOUNTER — Other Ambulatory Visit: Payer: Self-pay | Admitting: Nurse Practitioner

## 2020-05-31 DIAGNOSIS — I10 Essential (primary) hypertension: Secondary | ICD-10-CM

## 2020-06-04 ENCOUNTER — Other Ambulatory Visit: Payer: Self-pay | Admitting: Nurse Practitioner

## 2020-06-04 DIAGNOSIS — E039 Hypothyroidism, unspecified: Secondary | ICD-10-CM

## 2020-06-04 DIAGNOSIS — I10 Essential (primary) hypertension: Secondary | ICD-10-CM

## 2020-06-04 MED ORDER — LEVOTHYROXINE SODIUM 75 MCG PO TABS: 75.0000 ug | ORAL_TABLET | Freq: Every day | ORAL | 0 refills | Status: DC

## 2020-06-04 NOTE — Telephone Encounter (Signed)
Copied from CRM [id number]. Topic: Quick Communication - Rx Refill/Question >> Jun 04, 2020 10:31 AM Jens Som A wrote: Medication:levothyroxine (SYNTHROID) 75 MCG tablet [[id number]]  fluticasone (FLONASE) 50 MCG/ACT nasal spray [[id number]] lisinopril (ZESTRIL) 10 MG tablet [[id number]] patient needs 90 day supply   Has the patient contacted their pharmacy? Yes   (Agent: If yes, when and what did the pharmacy advise?) contact PCP   Preferred Pharmacy (with phone number or street name): [facility] [address] [place] [address] [phone]  Agent: Please be advised that RX refills may take up to 3 business days. We ask that you follow-up with your pharmacy.

## 2020-07-02 ENCOUNTER — Other Ambulatory Visit: Payer: Self-pay | Admitting: Nurse Practitioner

## 2020-07-02 DIAGNOSIS — E039 Hypothyroidism, unspecified: Secondary | ICD-10-CM

## 2020-07-13 ENCOUNTER — Other Ambulatory Visit: Payer: Self-pay

## 2020-07-13 ENCOUNTER — Other Ambulatory Visit (HOSPITAL_COMMUNITY)
Admission: RE | Admit: 2020-07-13 | Discharge: 2020-07-13 | Disposition: A | Discharge status: Home / Self Care | Payer: 59 | Source: Ambulatory Visit | Attending: Nurse Practitioner | Admitting: Nurse Practitioner

## 2020-07-13 ENCOUNTER — Encounter: Payer: Self-pay | Admitting: Nurse Practitioner

## 2020-07-13 ENCOUNTER — Ambulatory Visit (INDEPENDENT_AMBULATORY_CARE_PROVIDER_SITE_OTHER): Payer: 59 | Admitting: Nurse Practitioner

## 2020-07-13 VITALS — BP 116/59 | HR 59 | Temp 98.1°F | Ht 62.13 in | Wt 164.0 lb

## 2020-07-13 DIAGNOSIS — E039 Hypothyroidism, unspecified: Secondary | ICD-10-CM | POA: Diagnosis not present

## 2020-07-13 DIAGNOSIS — Z124 Encounter for screening for malignant neoplasm of cervix: Secondary | ICD-10-CM

## 2020-07-13 DIAGNOSIS — R748 Abnormal levels of other serum enzymes: Secondary | ICD-10-CM

## 2020-07-13 DIAGNOSIS — Z23 Encounter for immunization: Secondary | ICD-10-CM | POA: Diagnosis not present

## 2020-07-13 DIAGNOSIS — Z0000 Encounter for general adult medical examination without abnormal findings: Secondary | ICD-10-CM | POA: Diagnosis not present

## 2020-07-13 DIAGNOSIS — K219 Gastro-esophageal reflux disease without esophagitis: Secondary | ICD-10-CM | POA: Diagnosis not present

## 2020-07-13 DIAGNOSIS — I10 Essential (primary) hypertension: Secondary | ICD-10-CM

## 2020-07-13 DIAGNOSIS — Z6829 Body mass index (BMI) 29.0-29.9, adult: Secondary | ICD-10-CM

## 2020-07-13 MED ORDER — LISINOPRIL 5 MG PO TABS: 5.0000 mg | ORAL_TABLET | Freq: Every day | ORAL | 3 refills | Status: DC

## 2020-07-13 NOTE — Assessment & Plan Note (Addendum)
Recheck alk phos today.  Consider u/s in future if continued elevations or symptoms present.

## 2020-07-13 NOTE — Patient Instructions (Signed)
LET ME KNOW IF BP GREATER THEN 130/80 consistently with medication changes.   DASH Eating Plan DASH stands for Dietary Approaches to Stop Hypertension. The DASH eating plan is a healthy eating plan that has been shown to:  Reduce high blood pressure (hypertension).  Reduce your risk for type 2 diabetes, heart disease, and stroke.  Help with weight loss. What are tips for following this plan? Reading food labels  Check food labels for the amount of salt (sodium) per serving. Choose foods with less than 5 percent of the Daily Value of sodium. Generally, foods with less than 300 milligrams (mg) of sodium per serving fit into this eating plan.  To find whole grains, look for the word "whole" as the first word in the ingredient list. Shopping  Buy products labeled as "low-sodium" or "no salt added."  Buy fresh foods. Avoid canned foods and pre-made or frozen meals. Cooking  Avoid adding salt when cooking. Use salt-free seasonings or herbs instead of table salt or sea salt. Check with your health care provider or pharmacist before using salt substitutes.  Do not fry foods. Cook foods using healthy methods such as baking, boiling, grilling, roasting, and broiling instead.  Cook with heart-healthy oils, such as olive, canola, avocado, soybean, or sunflower oil. Meal planning  Eat a balanced diet that includes: ? 4 or more servings of fruits and 4 or more servings of vegetables each day. Try to fill one-half of your plate with fruits and vegetables. ? 6-8 servings of whole grains each day. ? Less than 6 oz (170 g) of lean meat, poultry, or fish each day. A 3-oz (85-g) serving of meat is about the same size as a deck of cards. One egg equals 1 oz (28 g). ? 2-3 servings of low-fat dairy each day. One serving is 1 cup (237 mL). ? 1 serving of nuts, seeds, or beans 5 times each week. ? 2-3 servings of heart-healthy fats. Healthy fats called omega-3 fatty acids are found in foods such as  walnuts, flaxseeds, fortified milks, and eggs. These fats are also found in cold-water fish, such as sardines, salmon, and mackerel.  Limit how much you eat of: ? Canned or prepackaged foods. ? Food that is high in trans fat, such as some fried foods. ? Food that is high in saturated fat, such as fatty meat. ? Desserts and other sweets, sugary drinks, and other foods with added sugar. ? Full-fat dairy products.  Do not salt foods before eating.  Do not eat more than 4 egg yolks a week.  Try to eat at least 2 vegetarian meals a week.  Eat more home-cooked food and less restaurant, buffet, and fast food.   Lifestyle  When eating at a restaurant, ask that your food be prepared with less salt or no salt, if possible.  If you drink alcohol: ? Limit how much you use to:  0-1 drink a day for women who are not pregnant.  0-2 drinks a day for men. ? Be aware of how much alcohol is in your drink. In the [place] one drink equals one 12 oz bottle of beer (355 mL), one 5 oz glass of wine (148 mL), or one 1 oz glass of hard liquor (44 mL). General information  Avoid eating more than 2,300 mg of salt a day. If you have hypertension, you may need to reduce your sodium intake to 1,500 mg a day.  Work with your health care provider to maintain a healthy body  weight or to lose weight. Ask what an ideal weight is for you.  Get at least 30 minutes of exercise that causes your heart to beat faster (aerobic exercise) most days of the week. Activities may include walking, swimming, or biking.  Work with your health care provider or dietitian to adjust your eating plan to your individual calorie needs. What foods should I eat? Fruits All fresh, dried, or frozen fruit. Canned fruit in natural juice (without added sugar). Vegetables Fresh or frozen vegetables (raw, steamed, roasted, or grilled). Low-sodium or reduced-sodium tomato and vegetable juice. Low-sodium or reduced-sodium tomato sauce and tomato  paste. Low-sodium or reduced-sodium canned vegetables. Grains Whole-grain or whole-wheat bread. Whole-grain or whole-wheat pasta. [place] rice. Orpah Cobb. Bulgur. Whole-grain and low-sodium cereals. Pita bread. Low-fat, low-sodium crackers. Whole-wheat flour tortillas. Meats and other proteins Skinless chicken or [place]. Ground chicken or [place]. Pork with fat trimmed off. Fish and seafood. Egg whites. Dried beans, peas, or lentils. Unsalted nuts, nut butters, and seeds. Unsalted canned beans. Lean cuts of beef with fat trimmed off. Low-sodium, lean precooked or cured meat, such as sausages or meat loaves. Dairy Low-fat (1%) or fat-free (skim) milk. Reduced-fat, low-fat, or fat-free cheeses. Nonfat, low-sodium ricotta or cottage cheese. Low-fat or nonfat yogurt. Low-fat, low-sodium cheese. Fats and oils Soft margarine without trans fats. Vegetable oil. Reduced-fat, low-fat, or light mayonnaise and salad dressings (reduced-sodium). Canola, safflower, olive, avocado, soybean, and sunflower oils. Avocado. Seasonings and condiments Herbs. Spices. Seasoning mixes without salt. Other foods Unsalted popcorn and pretzels. Fat-free sweets. The items listed above may not be a complete list of foods and beverages you can eat. Contact a dietitian for more information. What foods should I avoid? Fruits Canned fruit in a light or heavy syrup. Fried fruit. Fruit in cream or butter sauce. Vegetables Creamed or fried vegetables. Vegetables in a cheese sauce. Regular canned vegetables (not low-sodium or reduced-sodium). Regular canned tomato sauce and paste (not low-sodium or reduced-sodium). Regular tomato and vegetable juice (not low-sodium or reduced-sodium). Rosita Fire. Olives. Grains Baked goods made with fat, such as croissants, muffins, or some breads. Dry pasta or rice meal packs. Meats and other proteins Fatty cuts of meat. Ribs. Fried meat. Tomasa Blase. Bologna, salami, and other precooked or cured meats,  such as sausages or meat loaves. Fat from the back of a pig (fatback). Bratwurst. Salted nuts and seeds. Canned beans with added salt. Canned or smoked fish. Whole eggs or egg yolks. Chicken or [place] with skin. Dairy Whole or 2% milk, cream, and half-and-half. Whole or full-fat cream cheese. Whole-fat or sweetened yogurt. Full-fat cheese. Nondairy creamers. Whipped toppings. Processed cheese and cheese spreads. Fats and oils Butter. Stick margarine. Lard. Shortening. Ghee. Bacon fat. Tropical oils, such as coconut, palm kernel, or palm oil. Seasonings and condiments Onion salt, garlic salt, seasoned salt, table salt, and sea salt. Worcestershire sauce. Tartar sauce. Barbecue sauce. Teriyaki sauce. Soy sauce, including reduced-sodium. Steak sauce. Canned and packaged gravies. Fish sauce. Oyster sauce. Cocktail sauce. Store-bought horseradish. Ketchup. Mustard. Meat flavorings and tenderizers. Bouillon cubes. Hot sauces. Pre-made or packaged marinades. Pre-made or packaged taco seasonings. Relishes. Regular salad dressings. Other foods Salted popcorn and pretzels. The items listed above may not be a complete list of foods and beverages you should avoid. Contact a dietitian for more information. Where to find more information  National Heart, Lung, and Blood Institute: PopSteam.is  American Heart Association: www.heart.org  Academy of Nutrition and Dietetics: www.eatright.org  National Kidney Foundation: www.kidney.org Summary  The DASH  eating plan is a healthy eating plan that has been shown to reduce high blood pressure (hypertension). It may also reduce your risk for type 2 diabetes, heart disease, and stroke.  When on the DASH eating plan, aim to eat more fresh fruits and vegetables, whole grains, lean proteins, low-fat dairy, and heart-healthy fats.  With the DASH eating plan, you should limit salt (sodium) intake to 2,300 mg a day. If you have hypertension, you may need to reduce  your sodium intake to 1,500 mg a day.  Work with your health care provider or dietitian to adjust your eating plan to your individual calorie needs. This information is not intended to replace advice given to you by your health care provider. Make sure you discuss any questions you have with your health care provider. Document Revised: 04/29/2019 Document Reviewed: 04/29/2019 Elsevier Patient Education  2021 [employer].

## 2020-07-13 NOTE — Assessment & Plan Note (Signed)
Chronic, ongoing.  TSH and Free T4 today.  Continue current medication regimen and adjust as needed based on labs.   

## 2020-07-13 NOTE — Assessment & Plan Note (Signed)
Chronic, ongoing.  Recommend continued use of Pepcid as needed and keeping food journal, avoid trigger foods.  Labs today.

## 2020-07-13 NOTE — Assessment & Plan Note (Signed)
BMI 29.87.  Recommended eating smaller high protein, low fat meals more frequently and exercising 30 mins a day 5 times a week with a goal of 10-15lb weight loss in the next 3 months. Patient voiced their understanding and motivation to adhere to these recommendations.

## 2020-07-13 NOTE — Progress Notes (Signed)
BP (!) 116/59   Pulse (!) 59   Temp 98.1 F (36.7 C) (Oral)   Ht 5' 2.13" (1.578 m)   Wt 164 lb (74.4 kg)   LMP  (LMP Unknown)   SpO2 98%   BMI 29.87 kg/m    Subjective:    Patient ID: Kristen Larson, female    DOB: 14-Jun-1960, 60 y.o.   MRN: [id number]  HPI: Kristen Larson is a 60 y.o. female presenting on 07/13/2020 for comprehensive medical examination. Current medical complaints include:none  She currently lives with: self Menopausal Symptoms: no   HYPERTENSION  Continues on Lisinopril.  Satisfied with current treatment? yes Duration of hypertension: chronic BP monitoring frequency: not checking BP range:  BP medication side effects: no Aspirin: yes Recent stressors: no Recurrent headaches: no Visual changes: no Palpitations: no Dyspnea: no Chest pain: no Lower extremity edema: no Dizzy/lightheaded: no   HYPOTHYROIDISM Continues on Levo 75 MCG. Thyroid control status:stable Satisfied with current treatment? yes Medication side effects: no Medication compliance: good compliance Etiology of hypothyroidism:  Recent dose adjustment:no Fatigue: no Cold intolerance: no Heat intolerance: no Weight gain: no Weight loss: no Constipation: no Diarrhea/loose stools: no Palpitations: no Lower extremity edema: no Anxiety/depressed mood: no   GERD On review has history of elevation in alk phos being monitored.  Had gall bladder removed years ago.  Takes rare Pepcid. GERD control status: uncontrolled  Satisfied with current treatment? no Heartburn frequency: once or twice a week Medication side effects: no  Previous GERD medications: none Antacid use frequency:  none Nature: reflux Location: epigastric Heartburn duration: years Alleviatiating factors:  nothing Aggravating factors: certain foods Dysphagia: no Odynophagia:  no Hematemesis: no Blood in stool: no EGD: no   Depression Screen done today and results listed below:  Depression screen [facility] 2/9  07/13/2020 06/13/2019 01/26/2018 04/22/2017 07/13/2015  Decreased Interest 0 0 0 0 0  Down, Depressed, Hopeless 0 0 0 0 0  PHQ - 2 Score 0 0 0 0 0  Altered sleeping 0 0 1 0 -  Tired, decreased energy 0 0 0 0 -  Change in appetite 0 0 0 0 -  Feeling bad or failure about yourself  0 0 0 0 -  Trouble concentrating 0 0 0 0 -  Moving slowly or fidgety/restless 0 0 0 0 -  Suicidal thoughts 0 0 0 0 -  PHQ-9 Score 0 0 1 0 -    The patient does not have a history of falls. I did not complete a risk assessment for falls. A plan of care for falls was not documented.   Past Medical History:  Past Medical History:  Diagnosis Date  . Allergy   . GERD (gastroesophageal reflux disease)   . Hypertension   . Thyroid disease     Surgical History:  Past Surgical History:  Procedure Laterality Date  . CHOLECYSTECTOMY  05/25/14  . TONSILLECTOMY AND ADENOIDECTOMY      Medications:  Current Outpatient Medications on File Prior to Visit  Medication Sig  . aspirin 81 MG tablet Take 81 mg by mouth daily.  . fluticasone (FLONASE) 50 MCG/ACT nasal spray SPRAY 2 SPRAYS INTO EACH NOSTRIL EVERY DAY  . levothyroxine (SYNTHROID) 75 MCG tablet Take 1 tablet (75 mcg total) by mouth daily before breakfast.  . Multiple Vitamin (MULTIVITAMIN) tablet Take 1 tablet by mouth daily.   No current facility-administered medications on file prior to visit.    Allergies:  Allergies  Allergen Reactions  .  Penicillins Rash    Social History:  Social History   Socioeconomic History  . Marital status: Married    Spouse name: Not on file  . Number of children: Not on file  . Years of education: Not on file  . Highest education level: Not on file  Occupational History  . Not on file  Tobacco Use  . Smoking status: Never Smoker  . Smokeless tobacco: Never Used  Vaping Use  . Vaping Use: Never used  Substance and Sexual Activity  . Alcohol use: No    Alcohol/week: 0.0 standard drinks  . Drug use: No  . Sexual  activity: Yes  Other Topics Concern  . Not on file  Social History Narrative  . Not on file   Social Determinants of Health   Financial Resource Strain: Not on file  Food Insecurity: Not on file  Transportation Needs: Not on file  Physical Activity: Not on file  Stress: Not on file  Social Connections: Not on file  Intimate Partner Violence: Not on file   Social History   Tobacco Use  Smoking Status Never Smoker  Smokeless Tobacco Never Used   Social History   Substance and Sexual Activity  Alcohol Use No  . Alcohol/week: 0.0 standard drinks    Family History:  Family History  Problem Relation Age of Onset  . Heart disease Mother   . Thyroid disease Mother   . COPD Father   . Stroke Father   . Glaucoma Father   . Thyroid disease Sister   . Crohn's disease Brother   . Alzheimer's disease Maternal Grandmother   . Breast cancer Maternal Grandmother 31  . Heart disease Maternal Grandfather   . Lupus Paternal Grandmother   . Diabetes Paternal Grandfather   . Down syndrome Brother     Past medical history, surgical history, medications, allergies, family history and social history reviewed with patient today and changes made to appropriate areas of the chart.   Review of Systems - negative All other ROS negative except what is listed above and in the HPI.      Objective:    BP (!) 116/59   Pulse (!) 59   Temp 98.1 F (36.7 C) (Oral)   Ht 5' 2.13" (1.578 m)   Wt 164 lb (74.4 kg)   LMP  (LMP Unknown)   SpO2 98%   BMI 29.87 kg/m   Wt Readings from Last 3 Encounters:  07/13/20 164 lb (74.4 kg)  12/23/19 166 lb (75.3 kg)  06/13/19 167 lb 3.2 oz (75.8 kg)    Physical Exam Constitutional:      General: She is awake. She is not in acute distress.    Appearance: She is well-developed. She is not ill-appearing.  HENT:     Head: Normocephalic and atraumatic.     Right Ear: Hearing, tympanic membrane, ear canal and external ear normal. No drainage.     Left  Ear: Hearing, tympanic membrane, ear canal and external ear normal. No drainage.     Nose: Nose normal.     Right Sinus: No maxillary sinus tenderness or frontal sinus tenderness.     Left Sinus: No maxillary sinus tenderness or frontal sinus tenderness.     Mouth/Throat:     Mouth: Mucous membranes are moist.     Pharynx: Oropharynx is clear. Uvula midline. No pharyngeal swelling, oropharyngeal exudate or posterior oropharyngeal erythema.  Eyes:     General: Lids are normal.  Right eye: No discharge.        Left eye: No discharge.     Extraocular Movements: Extraocular movements intact.     Conjunctiva/sclera: Conjunctivae normal.     Pupils: Pupils are equal, round, and reactive to light.     Visual Fields: Right eye visual fields normal and left eye visual fields normal.  Neck:     Thyroid: No thyromegaly.     Vascular: No carotid bruit.     Trachea: Trachea normal.  Cardiovascular:     Rate and Rhythm: Normal rate and regular rhythm.     Heart sounds: Normal heart sounds. No murmur heard. No gallop.   Pulmonary:     Effort: Pulmonary effort is normal. No accessory muscle usage or respiratory distress.     Breath sounds: Normal breath sounds.  Chest:  Breasts:     Right: Normal. No axillary adenopathy or supraclavicular adenopathy.     Left: Normal. No axillary adenopathy or supraclavicular adenopathy.    Abdominal:     General: Bowel sounds are normal.     Palpations: Abdomen is soft. There is no hepatomegaly or splenomegaly.     Tenderness: There is no abdominal tenderness.     Hernia: There is no hernia in the left inguinal area or right inguinal area.  Genitourinary:    Exam position: Lithotomy position.     Labia:        Right: No rash.        Left: No rash.      Cervix: Friability present.     Adnexa: Right adnexa normal and left adnexa normal.     Rectum: Normal.     Comments: Vaginal walls with mild erythema, atrophic vaginitis.  Cervix anterior and well  viewed, pap sample obtained.  Mild friability noted with pap. Musculoskeletal:        General: Normal range of motion.     Cervical back: Normal range of motion and neck supple.     Right lower leg: No edema.     Left lower leg: No edema.  Lymphadenopathy:     Head:     Right side of head: No submental, submandibular, tonsillar, preauricular or posterior auricular adenopathy.     Left side of head: No submental, submandibular, tonsillar, preauricular or posterior auricular adenopathy.     Cervical: No cervical adenopathy.     Upper Body:     Right upper body: No supraclavicular, axillary or pectoral adenopathy.     Left upper body: No supraclavicular, axillary or pectoral adenopathy.  Skin:    General: Skin is warm and dry.     Capillary Refill: Capillary refill takes less than 2 seconds.     Findings: No rash.  Neurological:     Mental Status: She is alert and oriented to person, place, and time.     Cranial Nerves: Cranial nerves are intact.     Gait: Gait is intact.     Deep Tendon Reflexes: Reflexes are normal and symmetric.     Reflex Scores:      Brachioradialis reflexes are 2+ on the right side and 2+ on the left side.      Patellar reflexes are 2+ on the right side and 2+ on the left side. Psychiatric:        Attention and Perception: Attention normal.        Mood and Affect: Mood normal.        Speech: Speech normal.  Behavior: Behavior normal. Behavior is cooperative.        Thought Content: Thought content normal.        Judgment: Judgment normal.     Results for orders placed or performed in visit on 96/04/54  Basic metabolic panel  Result Value Ref Range   Glucose 83 65 - 99 mg/dL   BUN 16 6 - 24 mg/dL   Creatinine, Ser 1.00 0.57 - 1.00 mg/dL   GFR calc non Af Amer 62 >59 mL/min/1.73   GFR calc Af Amer 72 >59 mL/min/1.73   BUN/Creatinine Ratio 16 9 - 23   Sodium 142 134 - 144 mmol/L   Potassium 4.7 3.5 - 5.2 mmol/L   Chloride 104 96 - 106 mmol/L    CO2 23 20 - 29 mmol/L   Calcium 9.5 8.7 - 10.2 mg/dL  HgB A1c  Result Value Ref Range   Hgb A1c MFr Bld 5.4 4.8 - 5.6 %   Est. average glucose Bld gHb Est-mCnc 108 mg/dL      Assessment & Plan:   Problem List Items Addressed This Visit      Cardiovascular and Mediastinum   Hypertension - Primary    Chronic, ongoing.  BP well below goal today.  Will decrease Lisinopril to 5 MG daily and assess -- patient aware to check BP at home and if consistent elevation >130/80 will return to 10 MG dosing. CMP and lipid today.  Recommend she monitor BP daily at home and document + focus on DASH diet.  Return in 6 months for follow-up, sooner if elevations in BP at home.      Relevant Medications   lisinopril (ZESTRIL) 5 MG tablet   Other Relevant Orders   CBC with Differential/Platelet   Comprehensive metabolic panel   Lipid Panel w/o Chol/HDL Ratio     Digestive   GERD (gastroesophageal reflux disease)    Chronic, ongoing.  Recommend continued use of Pepcid as needed and keeping food journal, avoid trigger foods.  Labs today.        Endocrine   Hypothyroidism    Chronic, ongoing.  TSH and Free T4 today.  Continue current medication regimen and adjust as needed based on labs.        Relevant Orders   TSH   T4, free     Other   Elevated alkaline phosphatase level    Recheck alk phos today.  Consider u/s in future if continued elevations or symptoms present.      BMI 29.0-29.9,adult    BMI 29.87.  Recommended eating smaller high protein, low fat meals more frequently and exercising 30 mins a day 5 times a week with a goal of 10-15lb weight loss in the next 3 months. Patient voiced their understanding and motivation to adhere to these recommendations.        Other Visit Diagnoses    Need for influenza vaccination       Flu vaccine today   Relevant Orders   Flu Vaccine QUAD 36+ mos IM (Completed)   Cervical cancer screening       Pap smear performed today and sent to lab -- if  normal repeat in 5 years   Relevant Orders   Cytology - PAP   Annual physical exam       Annual labs today to include CBC, CMP, TSH, lipid.  Health maintenance reviewed and discussed.       Follow up plan: Return in about 6 months (around 01/10/2021) for HTN, THYROID.  LABORATORY TESTING:  - Pap smear: Done today  IMMUNIZATIONS:   - Tdap: Tetanus vaccination status reviewed: last tetanus booster within 10 years. - Influenza: Administered today - Pneumovax: Not applicable - Prevnar: Not applicable - HPV: Not applicable - Zostavax vaccine: she is going to contact insurance about coverage  - Covid vaccines -- not obtained  SCREENING: -Mammogram: Up to date -- in September - Colonoscopy: Up to date  - Bone Density: Not applicable  -Hearing Test: Not applicable  -Spirometry: Not applicable   PATIENT COUNSELING:   Advised to take 1 mg of folate supplement per day if capable of pregnancy.   Sexuality: Discussed sexually transmitted diseases, partner selection, use of condoms, avoidance of unintended pregnancy  and contraceptive alternatives.   Advised to avoid cigarette smoking.  I discussed with the patient that most people either abstain from alcohol or drink within safe limits (<=14/week and <=4 drinks/occasion for males, <=7/weeks and <= 3 drinks/occasion for females) and that the risk for alcohol disorders and other health effects rises proportionally with the number of drinks per week and how often a drinker exceeds daily limits.  Discussed cessation/primary prevention of drug use and availability of treatment for abuse.   Diet: Encouraged to adjust caloric intake to maintain  or achieve ideal body weight, to reduce intake of dietary saturated fat and total fat, to limit sodium intake by avoiding high sodium foods and not adding table salt, and to maintain adequate dietary potassium and calcium preferably from fresh fruits, vegetables, and low-fat dairy products.    Stressed  the importance of regular exercise  Injury prevention: Discussed safety belts, safety helmets, smoke detector, smoking near bedding or upholstery.   Dental health: Discussed importance of regular tooth brushing, flossing, and dental visits.    NEXT PREVENTATIVE PHYSICAL DUE IN 1 YEAR. Return in about 6 months (around 01/10/2021) for HTN, THYROID.

## 2020-07-13 NOTE — Assessment & Plan Note (Signed)
Chronic, ongoing.  BP well below goal today.  Will decrease Lisinopril to 5 MG daily and assess -- patient aware to check BP at home and if consistent elevation >130/80 will return to 10 MG dosing. CMP and lipid today.  Recommend she monitor BP daily at home and document + focus on DASH diet.  Return in 6 months for follow-up, sooner if elevations in BP at home.

## 2020-07-14 LAB — CBC WITH DIFFERENTIAL/PLATELET
Basophils Absolute: 0.1 10*3/uL (ref 0.0–0.2)
Basos: 1 %
EOS (ABSOLUTE): 0.2 10*3/uL (ref 0.0–0.4)
Eos: 3 %
Hematocrit: 46.9 % — ABNORMAL HIGH (ref 34.0–46.6)
Hemoglobin: 15.6 g/dL (ref 11.1–15.9)
Immature Grans (Abs): 0 10*3/uL (ref 0.0–0.1)
Immature Granulocytes: 1 %
Lymphocytes Absolute: 1.3 10*3/uL (ref 0.7–3.1)
Lymphs: 23 %
MCH: 29.2 pg (ref 26.6–33.0)
MCHC: 33.3 g/dL (ref 31.5–35.7)
MCV: 88 fL (ref 79–97)
Monocytes Absolute: 0.4 10*3/uL (ref 0.1–0.9)
Monocytes: 8 %
Neutrophils Absolute: 3.6 10*3/uL (ref 1.4–7.0)
Neutrophils: 64 %
Platelets: 218 10*3/uL (ref 150–450)
RBC: 5.35 x10E6/uL — ABNORMAL HIGH (ref 3.77–5.28)
RDW: 12.4 % (ref 11.7–15.4)
WBC: 5.6 10*3/uL (ref 3.4–10.8)

## 2020-07-14 LAB — LIPID PANEL W/O CHOL/HDL RATIO
Cholesterol, Total: 197 mg/dL (ref 100–199)
HDL: 50 mg/dL (ref >39)
LDL Chol Calc (NIH): 127 mg/dL — ABNORMAL HIGH (ref 0–99)
Triglycerides: 111 mg/dL (ref 0–149)
VLDL Cholesterol Cal: 20 mg/dL (ref 5–40)

## 2020-07-14 LAB — COMPREHENSIVE METABOLIC PANEL
ALT: 16 IU/L (ref 0–32)
AST: 19 IU/L (ref 0–40)
Albumin/Globulin Ratio: 2 (ref 1.2–2.2)
Albumin: 4.5 g/dL (ref 3.8–4.9)
Alkaline Phosphatase: 150 IU/L — ABNORMAL HIGH (ref 44–121)
BUN/Creatinine Ratio: 14 (ref 9–23)
BUN: 14 mg/dL (ref 6–24)
Bilirubin Total: 0.6 mg/dL (ref 0.0–1.2)
CO2: 25 mmol/L (ref 20–29)
Calcium: 9.7 mg/dL (ref 8.7–10.2)
Chloride: 103 mmol/L (ref 96–106)
Creatinine, Ser: 1.03 mg/dL — ABNORMAL HIGH (ref 0.57–1.00)
GFR calc Af Amer: 69 mL/min/{1.73_m2} (ref >59)
GFR calc non Af Amer: 60 mL/min/{1.73_m2} (ref >59)
Globulin, Total: 2.3 g/dL (ref 1.5–4.5)
Glucose: 76 mg/dL (ref 65–99)
Potassium: 4.2 mmol/L (ref 3.5–5.2)
Sodium: 141 mmol/L (ref 134–144)
Total Protein: 6.8 g/dL (ref 6.0–8.5)

## 2020-07-14 LAB — TSH: TSH: 2.78 u[IU]/mL (ref 0.450–4.500)

## 2020-07-14 LAB — T4, FREE: Free T4: 1.79 ng/dL — ABNORMAL HIGH (ref 0.82–1.77)

## 2020-07-15 NOTE — Progress Notes (Signed)
Please let Darien know her labs have returned and are overall remaining stable.  CBC shows no anemia.  Kidney, liver function and electrolytes remain normal.  Alkaline phophatase is still mildly elevated, but no worsening, we will continue to monitor and obtain imaging if any worsening.  Thyroid labs stable, continue current Levothyroxine dosing.  LDL, bad cholesterol, is elevated this check, but overall risk score for cardiac event is low.  No need to start medication at this time, but do recommend heavy focus on diet to prevent cholesterol levels from increasing.  Any questions? Keep being awesome!!  Thank you for allowing me to participate in your care. Kindest regards, Elease Swarm

## 2020-07-17 ENCOUNTER — Encounter: Payer: Self-pay | Admitting: Nurse Practitioner

## 2020-07-17 ENCOUNTER — Ambulatory Visit: Payer: Self-pay | Admitting: *Deleted

## 2020-07-17 DIAGNOSIS — N952 Postmenopausal atrophic vaginitis: Secondary | ICD-10-CM | POA: Insufficient documentation

## 2020-07-17 LAB — CYTOLOGY - PAP
Comment: NEGATIVE
Diagnosis: NEGATIVE
High risk HPV: NEGATIVE

## 2020-07-17 NOTE — Progress Notes (Signed)
Good morning, please let Kristen Larson know her pap has returned and all is negative, we do not need to repeat for 5 years and at that time if negative we can consider this last pap unless she wishes to continue.  However, guidelines recommended stopping at 65 if negative paps prior exams.  She did show some atrophic changes, as we discussed happens post menopause, if ever any discomfort with this please let me know.  Have a great day!! Keep being awesome!!  Thank you for allowing me to participate in your care. Kindest regards, Denay Pleitez

## 2020-07-17 NOTE — Telephone Encounter (Signed)
Pt called in and was given the message from Aura Dials, NP dated 07/17/2020 at 9:37 AM regarding her PAP result.  Pt did not have any questions.

## 2020-07-17 NOTE — Progress Notes (Signed)
Called patient to discuss lab results, no answer, left a voicemail for patient to return my call.  ?  ? ? ?

## 2020-08-12 ENCOUNTER — Other Ambulatory Visit: Payer: Self-pay | Admitting: Nurse Practitioner

## 2020-08-12 DIAGNOSIS — E039 Hypothyroidism, unspecified: Secondary | ICD-10-CM

## 2020-08-12 NOTE — Telephone Encounter (Signed)
Requested Prescriptions  Pending Prescriptions Disp Refills  . levothyroxine (SYNTHROID) 75 MCG tablet [Pharmacy Med Name: LEVOTHYROXINE 75 MCG TABLET] 90 tablet 3    Sig: TAKE 1 TABLET (75 MCG TOTAL) BY MOUTH DAILY BEFORE BREAKFAST.     Endocrinology:  Hypothyroid Agents Failed - 08/12/2020  3:26 PM      Failed - TSH needs to be rechecked within 3 months after an abnormal result. Refill until TSH is due.      Passed - TSH in normal range and within 360 days    TSH  Date Value Ref Range Status  07/13/2020 2.780 0.450 - 4.500 uIU/mL Final         Passed - Valid encounter within last 12 months    Recent Outpatient Visits          1 month ago Primary hypertension   [facility] [place], Dorie Rank, NP   7 months ago Essential hypertension   [facility] [place], [place] T, NP   12 months ago Acute gout of right foot, unspecified cause   [facility] [place], Dorie Rank, NP   1 year ago Annual physical exam   [facility] [place], Dorie Rank, NP   1 year ago Essential hypertension   [facility] [place], Dorie Rank, NP      Future Appointments            In 5 months [place], Dorie Rank, NP [employer] [facility]

## 2020-08-22 ENCOUNTER — Encounter: Payer: Self-pay | Admitting: Nurse Practitioner

## 2020-08-22 ENCOUNTER — Other Ambulatory Visit: Payer: Self-pay

## 2020-08-22 ENCOUNTER — Ambulatory Visit: Payer: 59 | Admitting: Nurse Practitioner

## 2020-08-22 VITALS — BP 133/76 | HR 54 | Temp 98.4°F

## 2020-08-22 DIAGNOSIS — M10071 Idiopathic gout, right ankle and foot: Secondary | ICD-10-CM

## 2020-08-22 MED ORDER — PREDNISONE 10 MG PO TABS: ORAL_TABLET | ORAL | 0 refills | Status: DC

## 2020-08-22 NOTE — Patient Instructions (Signed)
AVOID Beer and grain liquors (like vodka and whiskey) Red meat, lamb, and pork. Organ meats, such as liver, kidneys, and glandular meats like the thymus or pancreas (you may hear them called sweetbreads) Seafood, especially shellfish like shrimp, lobster, mussels, anchovies, and sardines.  Gout  Gout is painful swelling of your joints. Gout is a type of arthritis. It is caused by having too much uric acid in your body. Uric acid is a chemical that is made when your body breaks down substances called purines. If your body has too much uric acid, sharp crystals can form and build up in your joints. This causes pain and swelling. Gout attacks can happen quickly and be very painful (acute gout). Over time, the attacks can affect more joints and happen more often (chronic gout). What are the causes?  Too much uric acid in your blood. This can happen because: ? Your kidneys do not remove enough uric acid from your blood. ? Your body makes too much uric acid. ? You eat too many foods that are high in purines. These foods include organ meats, some seafood, and beer.  Trauma or stress. What increases the risk?  Having a family history of gout.  Being female and middle-aged.  Being female and having gone through menopause.  Being very overweight (obese).  Drinking alcohol, especially beer.  Not having enough water in the body (being dehydrated).  Losing weight too quickly.  Having an organ transplant.  Having lead poisoning.  Taking certain medicines.  Having kidney disease.  Having a skin condition called psoriasis. What are the signs or symptoms? An attack of acute gout usually happens in just one joint. The most common place is the big toe. Attacks often start at night. Other joints that may be affected include joints of the feet, ankle, knee, fingers, wrist, or elbow. Symptoms of an attack may include:  Very bad pain.  Warmth.  Swelling.  Stiffness.  Shiny, red, or  purple skin.  Tenderness. The affected joint may be very painful to touch.  Chills and fever. Chronic gout may cause symptoms more often. More joints may be involved. You may also have white or yellow lumps (tophi) on your hands or feet or in other areas near your joints.   How is this treated?  Treatment for this condition has two phases: treating an acute attack and preventing future attacks.  Acute gout treatment may include: ? NSAIDs. ? Steroids. These are taken by mouth or injected into a joint. ? Colchicine. This medicine relieves pain and swelling. It can be given by mouth or through an IV tube.  Preventive treatment may include: ? Taking small doses of NSAIDs or colchicine daily. ? Using a medicine that reduces uric acid levels in your blood. ? Making changes to your diet. You may need to see a food expert (dietitian) about what to eat and drink to prevent gout. Follow these instructions at home: During a gout attack  If told, put ice on the painful area: ? Put ice in a plastic bag. ? Place a towel between your skin and the bag. ? Leave the ice on for 20 minutes, 2-3 times a day.  Raise (elevate) the painful joint above the level of your heart as often as you can.  Rest the joint as much as possible. If the joint is in your leg, you may be given crutches.  Follow instructions from your doctor about what you cannot eat or drink.   Avoiding future gout  attacks  Eat a low-purine diet. Avoid foods and drinks such as: ? Liver. ? Kidney. ? Anchovies. ? Asparagus. ? Herring. ? Mushrooms. ? Mussels. ? Beer.  Stay at a healthy weight. If you want to lose weight, talk with your doctor. Do not lose weight too fast.  Start or continue an exercise plan as told by your doctor. Eating and drinking  Drink enough fluids to keep your pee (urine) pale yellow.  If you drink alcohol: ? Limit how much you use to:  0-1 drink a day for women.  0-2 drinks a day for men. ? Be  aware of how much alcohol is in your drink. In the [place] one drink equals one 12 oz bottle of beer (355 mL), one 5 oz glass of wine (148 mL), or one 1 oz glass of hard liquor (44 mL). General instructions  Take over-the-counter and prescription medicines only as told by your doctor.  Do not drive or use heavy machinery while taking prescription pain medicine.  Return to your normal activities as told by your doctor. Ask your doctor what activities are safe for you.  Keep all follow-up visits as told by your doctor. This is important. Contact a doctor if:  You have another gout attack.  You still have symptoms of a gout attack after 10 days of treatment.  You have problems (side effects) because of your medicines.  You have chills or a fever.  You have burning pain when you pee (urinate).  You have pain in your lower back or belly. Get help right away if:  You have very bad pain.  Your pain cannot be controlled.  You cannot pee. Summary  Gout is painful swelling of the joints.  The most common site of pain is the big toe, but it can affect other joints.  Medicines and avoiding some foods can help to prevent and treat gout attacks. This information is not intended to replace advice given to you by your health care provider. Make sure you discuss any questions you have with your health care provider. Document Revised: 12/16/2017 Document Reviewed: 12/16/2017 Elsevier Patient Education  2021 [employer].

## 2020-08-22 NOTE — Assessment & Plan Note (Signed)
Acute for less then 24 hours.  Obtain labs today to include CBC, CMP, uric acid.  Send in Prednisone taper.  Recommend monitoring diet and discussed foods to avoid at home.  Could consider addition of Allopurinol if ongoing flares or uric acid elevation present.  Return for worsening or ongoing symptoms.

## 2020-08-22 NOTE — Progress Notes (Signed)
BP 133/76   Pulse (!) 54   Temp 98.4 F (36.9 C) (Oral)   LMP  (LMP Unknown)   SpO2 99%    Subjective:    Patient ID: Kristen Larson, female    DOB: 1961-03-13, 60 y.o.   MRN: [id number]  HPI: Kristen Larson is a 60 y.o. female  Chief Complaint  Patient presents with  . Gout    Patient states she thinks that it may have started some time during the night. Patient states she is having a possible gout flare up in right foot.    GOUT Started with current flare in the middle of the night to right foot, last flare was 08/16/19 and treated with Prednisone successfully.   Duration:days Right 1st metatarsophalangeal pain: yes Left 1st metatarsophalangeal pain: no Right knee pain: no Left knee pain: no Severity: 7/10  Quality: dull, aching and throbbing Swelling: yes Redness: yes Trauma: no Recent dietary change or indiscretion: not that she knows of Fevers: no Nausea/vomiting: no Aggravating factors: walking on foot Alleviating factors: nothing Status:  stable Treatments attempted: nothing  Relevant past medical, surgical, family and social history reviewed and updated as indicated. Interim medical history since our last visit reviewed. Allergies and medications reviewed and updated.  Review of Systems  Constitutional: Negative for activity change, appetite change, diaphoresis, fatigue and fever.  Respiratory: Negative for cough, chest tightness and shortness of breath.   Cardiovascular: Negative for chest pain, palpitations and leg swelling.  Musculoskeletal: Positive for joint swelling.  Neurological: Negative.   Psychiatric/Behavioral: Negative.     Per HPI unless specifically indicated above     Objective:    BP 133/76   Pulse (!) 54   Temp 98.4 F (36.9 C) (Oral)   LMP  (LMP Unknown)   SpO2 99%   Wt Readings from Last 3 Encounters:  07/13/20 164 lb (74.4 kg)  12/23/19 166 lb (75.3 kg)  06/13/19 167 lb 3.2 oz (75.8 kg)    Physical Exam Vitals and  nursing note reviewed.  Constitutional:      General: She is awake. She is not in acute distress.    Appearance: She is well-developed and overweight. She is not ill-appearing.  HENT:     Head: Normocephalic.     Right Ear: Hearing normal.     Left Ear: Hearing normal.  Eyes:     General: Lids are normal.        Right eye: No discharge.        Left eye: No discharge.     Conjunctiva/sclera: Conjunctivae normal.     Pupils: Pupils are equal, round, and reactive to light.  Neck:     Vascular: No carotid bruit.  Cardiovascular:     Rate and Rhythm: Normal rate and regular rhythm.     Pulses:          Dorsalis pedis pulses are 2+ on the right side and 2+ on the left side.       Posterior tibial pulses are 2+ on the right side and 2+ on the left side.     Heart sounds: Normal heart sounds. No murmur heard. No gallop.   Pulmonary:     Effort: Pulmonary effort is normal. No accessory muscle usage or respiratory distress.     Breath sounds: Normal breath sounds.  Abdominal:     General: Bowel sounds are normal.     Palpations: Abdomen is soft.  Musculoskeletal:     Cervical back: Normal  range of motion and neck supple.     Right lower leg: No edema.     Left lower leg: No edema.     Right foot: Normal range of motion.     Left foot: Normal range of motion.       Feet:  Feet:     Right foot:     Skin integrity: Erythema (to lateral aspect) and warmth present.     Toenail Condition: Right toenails are normal.     Left foot:     Skin integrity: Skin integrity normal.     Toenail Condition: Left toenails are normal.     Comments: Antalgic gait present. Skin:    General: Skin is warm and dry.  Neurological:     Mental Status: She is alert and oriented to person, place, and time.  Psychiatric:        Attention and Perception: Attention normal.        Mood and Affect: Mood normal.        Speech: Speech normal.        Behavior: Behavior normal. Behavior is cooperative.     Results for orders placed or performed in visit on 07/13/20  CBC with Differential/Platelet  Result Value Ref Range   WBC 5.6 3.4 - 10.8 x10E3/uL   RBC 5.35 (H) 3.77 - 5.28 x10E6/uL   Hemoglobin 15.6 11.1 - 15.9 g/dL   Hematocrit 01.6 (H) 01.0 - 46.6 %   MCV 88 79 - 97 fL   MCH 29.2 26.6 - 33.0 pg   MCHC 33.3 31.5 - 35.7 g/dL   RDW 93.2 35.5 - 73.2 %   Platelets 218 150 - 450 x10E3/uL   Neutrophils 64 Not Estab. %   Lymphs 23 Not Estab. %   Monocytes 8 Not Estab. %   Eos 3 Not Estab. %   Basos 1 Not Estab. %   Neutrophils Absolute 3.6 1.4 - 7.0 x10E3/uL   Lymphocytes Absolute 1.3 0.7 - 3.1 x10E3/uL   Monocytes Absolute 0.4 0.1 - 0.9 x10E3/uL   EOS (ABSOLUTE) 0.2 0.0 - 0.4 x10E3/uL   Basophils Absolute 0.1 0.0 - 0.2 x10E3/uL   Immature Granulocytes 1 Not Estab. %   Immature Grans (Abs) 0.0 0.0 - 0.1 x10E3/uL  Comprehensive metabolic panel  Result Value Ref Range   Glucose 76 65 - 99 mg/dL   BUN 14 6 - 24 mg/dL   Creatinine, Ser 2.02 (H) 0.57 - 1.00 mg/dL   GFR calc non Af Amer 60 >59 mL/min/1.73   GFR calc Af Amer 69 >59 mL/min/1.73   BUN/Creatinine Ratio 14 9 - 23   Sodium 141 134 - 144 mmol/L   Potassium 4.2 3.5 - 5.2 mmol/L   Chloride 103 96 - 106 mmol/L   CO2 25 20 - 29 mmol/L   Calcium 9.7 8.7 - 10.2 mg/dL   Total Protein 6.8 6.0 - 8.5 g/dL   Albumin 4.5 3.8 - 4.9 g/dL   Globulin, Total 2.3 1.5 - 4.5 g/dL   Albumin/Globulin Ratio 2.0 1.2 - 2.2   Bilirubin Total 0.6 0.0 - 1.2 mg/dL   Alkaline Phosphatase 150 (H) 44 - 121 IU/L   AST 19 0 - 40 IU/L   ALT 16 0 - 32 IU/L  Lipid Panel w/o Chol/HDL Ratio  Result Value Ref Range   Cholesterol, Total 197 100 - 199 mg/dL   Triglycerides 542 0 - 149 mg/dL   HDL 50 >70 mg/dL   VLDL Cholesterol Cal 20 5 -  40 mg/dL   LDL Chol Calc (NIH) 465 (H) 0 - 99 mg/dL  TSH  Result Value Ref Range   TSH 2.780 0.450 - 4.500 uIU/mL  T4, free  Result Value Ref Range   Free T4 1.79 (H) 0.82 - 1.77 ng/dL  Cytology - PAP  Result  Value Ref Range   High risk HPV Negative    Adequacy      Satisfactory for evaluation; transformation zone component PRESENT.   Diagnosis      - Negative for intraepithelial lesion or malignancy (NILM)   Comment Inflammation and atrophic changes are present.    Comment Normal Reference Range HPV - Negative       Assessment & Plan:   Problem List Items Addressed This Visit      Musculoskeletal and Integument   Acute idiopathic gout involving toe of right foot - Primary    Acute for less then 24 hours.  Obtain labs today to include CBC, CMP, uric acid.  Send in Prednisone taper.  Recommend monitoring diet and discussed foods to avoid at home.  Could consider addition of Allopurinol if ongoing flares or uric acid elevation present.  Return for worsening or ongoing symptoms.      Relevant Medications   predniSONE (DELTASONE) 10 MG tablet   Other Relevant Orders   CBC with Differential/Platelet   Comprehensive metabolic panel   Uric acid       Follow up plan: Return for as scheduled in summer.

## 2020-08-23 ENCOUNTER — Other Ambulatory Visit: Payer: Self-pay | Admitting: Nurse Practitioner

## 2020-08-23 DIAGNOSIS — M10071 Idiopathic gout, right ankle and foot: Secondary | ICD-10-CM

## 2020-08-23 DIAGNOSIS — R748 Abnormal levels of other serum enzymes: Secondary | ICD-10-CM

## 2020-08-23 LAB — CBC WITH DIFFERENTIAL/PLATELET
Basophils Absolute: 0.1 10*3/uL (ref 0.0–0.2)
Basos: 1 %
EOS (ABSOLUTE): 0.2 10*3/uL (ref 0.0–0.4)
Eos: 2 %
Hematocrit: 45.4 % (ref 34.0–46.6)
Hemoglobin: 15.2 g/dL (ref 11.1–15.9)
Immature Grans (Abs): 0 10*3/uL (ref 0.0–0.1)
Immature Granulocytes: 1 %
Lymphocytes Absolute: 1.5 10*3/uL (ref 0.7–3.1)
Lymphs: 21 %
MCH: 28.5 pg (ref 26.6–33.0)
MCHC: 33.5 g/dL (ref 31.5–35.7)
MCV: 85 fL (ref 79–97)
Monocytes Absolute: 0.5 10*3/uL (ref 0.1–0.9)
Monocytes: 6 %
Neutrophils Absolute: 5.2 10*3/uL (ref 1.4–7.0)
Neutrophils: 69 %
Platelets: 211 10*3/uL (ref 150–450)
RBC: 5.34 x10E6/uL — ABNORMAL HIGH (ref 3.77–5.28)
RDW: 12.4 % (ref 11.7–15.4)
WBC: 7.5 10*3/uL (ref 3.4–10.8)

## 2020-08-23 LAB — COMPREHENSIVE METABOLIC PANEL
ALT: 27 IU/L (ref 0–32)
AST: 23 IU/L (ref 0–40)
Albumin/Globulin Ratio: 1.8 (ref 1.2–2.2)
Albumin: 4.4 g/dL (ref 3.8–4.9)
Alkaline Phosphatase: 170 IU/L — ABNORMAL HIGH (ref 44–121)
BUN/Creatinine Ratio: 14 (ref 9–23)
BUN: 14 mg/dL (ref 6–24)
Bilirubin Total: 1 mg/dL (ref 0.0–1.2)
CO2: 22 mmol/L (ref 20–29)
Calcium: 9.5 mg/dL (ref 8.7–10.2)
Chloride: 102 mmol/L (ref 96–106)
Creatinine, Ser: 1.03 mg/dL — ABNORMAL HIGH (ref 0.57–1.00)
Globulin, Total: 2.4 g/dL (ref 1.5–4.5)
Glucose: 82 mg/dL (ref 65–99)
Potassium: 4.2 mmol/L (ref 3.5–5.2)
Sodium: 141 mmol/L (ref 134–144)
Total Protein: 6.8 g/dL (ref 6.0–8.5)
eGFR: 63 mL/min/{1.73_m2} (ref >59)

## 2020-08-23 LAB — URIC ACID: Uric Acid: 7.1 mg/dL (ref 3.0–7.2)

## 2020-08-23 MED ORDER — ALLOPURINOL 100 MG PO TABS: 100.0000 mg | ORAL_TABLET | Freq: Every day | ORAL | 4 refills | Status: DC

## 2020-08-23 NOTE — Progress Notes (Signed)
Good afternoon please let Natsuko know her labs have returned: - Kidney and liver function remain stable, alkaline phosphatase (which often looks at gall bladder) is mildly elevated and we will continue to monitor this.   - CBC shows no infection - Uric acid remains slightly above goal and it would be good to start a low dose of Allopurinol 100 MG daily to help lower this and prevent ongoing flares of gout.  I will send this in and would like her to return in 6 weeks for lab visit only to recheck this level.  Please schedule.  Any questions? Keep being awesome!!  Thank you for allowing me to participate in your care. Kindest regards, Remmie Bembenek

## 2020-08-31 ENCOUNTER — Other Ambulatory Visit: Payer: Self-pay | Admitting: Nurse Practitioner

## 2020-08-31 DIAGNOSIS — E039 Hypothyroidism, unspecified: Secondary | ICD-10-CM

## 2020-09-07 ENCOUNTER — Encounter: Payer: Self-pay | Admitting: Nurse Practitioner

## 2020-09-07 ENCOUNTER — Other Ambulatory Visit: Payer: Self-pay

## 2020-09-07 ENCOUNTER — Ambulatory Visit (INDEPENDENT_AMBULATORY_CARE_PROVIDER_SITE_OTHER): Payer: 59 | Admitting: Nurse Practitioner

## 2020-09-07 VITALS — BP 109/66 | HR 62 | Temp 98.7°F | Wt 161.2 lb

## 2020-09-07 DIAGNOSIS — J069 Acute upper respiratory infection, unspecified: Secondary | ICD-10-CM

## 2020-09-07 DIAGNOSIS — M10071 Idiopathic gout, right ankle and foot: Secondary | ICD-10-CM

## 2020-09-07 MED ORDER — COLCHICINE 0.6 MG PO TABS: ORAL_TABLET | ORAL | 0 refills | Status: DC

## 2020-09-07 MED ORDER — BENZONATATE 100 MG PO CAPS: 100.0000 mg | ORAL_CAPSULE | Freq: Two times a day (BID) | ORAL | 0 refills | Status: DC | PRN

## 2020-09-07 NOTE — Patient Instructions (Signed)
It was great to see you!  Can take claritin or zyrtec once a day over the counter. Keep taking your flonase. Drink plenty of fluids. You can also use nasal saline spray or taking warm showers with steam. I also sending in colchicine for your gout  Take care,  Rodman Pickle, NP   Allergies, Adult An allergy is a condition in which the body's defense system (immune system) comes in contact with an allergen and reacts to it. An allergen is anything that causes an allergic reaction. Allergens cause the immune system to make proteins for fighting infections (antibodies). These antibodies cause cells to release chemicals called histamines that set off the symptoms of an allergic reaction. Allergies often affect the nasal passages (allergic rhinitis), eyes (allergic conjunctivitis), skin (atopic dermatitis), and stomach. Allergies can be mild, moderate, or severe. They cannot spread from person to person. Allergies can develop at any age and may be outgrown. What are the causes? This condition is caused by allergens. Common allergens include:  Outdoor allergens, such as pollen, car fumes, and mold.  Indoor allergens, such as dust, smoke, mold, and pet dander.  Other allergens, such as foods, medicines, scents, insect bites or stings, and other skin irritants. What increases the risk? You are more likely to develop this condition if you have:  Family members with allergies.  Family members who have any condition that may be caused by allergens, such as asthma. This may make you more likely to have other allergies. What are the signs or symptoms? Symptoms of this condition depend on the severity of the allergy. Mild to moderate symptoms  Runny nose, stuffy nose (nasal congestion), or sneezing.  Itchy mouth, ears, or throat.  A feeling of mucus dripping down the back of your throat (postnasal drip).  Sore throat.  Itchy, red, watery, or puffy eyes.  Skin rash, or itchy, red, swollen  areas of skin (hives).  Stomach cramps or bloating. Severe symptoms Severe allergies to food, medicine, or insect bites may cause anaphylaxis, which can be life-threatening. Symptoms include:  A red (flushed) face.  Wheezing or coughing.  Swollen lips, tongue, or mouth.  Tight or swollen throat.  Chest pain or tightness, or rapid heartbeat.  Trouble breathing or shortness of breath.  Pain in the abdomen, vomiting, or diarrhea.  Dizziness or fainting. How is this diagnosed? This condition is diagnosed based on your symptoms, your family and medical history, and a physical exam. You may also have tests, including:  Skin tests to see how your skin reacts to allergens that may be causing your symptoms. Tests include: ? Skin prick test. For this test, an allergen is introduced to your body through a small opening in the skin. ? Intradermal skin test. For this test, a small amount of allergen is injected under the first layer of your skin. ? Patch test. For this test, a small amount of allergen is placed on your skin. The area is covered and then checked after a few days.  Blood tests.  A challenge test. For this test, you will eat or breathe in a small amount of allergen to see if you have an allergic reaction. You may also be asked to:  Keep a food diary. This is a record of all the foods, drinks, and symptoms you have in a day.  Try an elimination diet. To do this: ? Remove certain foods from your diet. ? Add those foods back one by one to find out if any foods cause  an allergic reaction. How is this treated? Treatment for allergies depends on your symptoms. Treatment may include:  Cold, wet cloths (cold compresses) to soothe itching and swelling.  Eye drops or nasal sprays.  Nasal irrigation to help clear your mucus or keep the nasal passages moist.  A humidifier to add moisture to the air.  Skin creams to treat rashes or itching.  Oral antihistamines or other  medicines to block the reaction or to treat inflammation.  Diet changes to remove foods that cause allergies.  Being exposed again and again to tiny amounts of allergens to help you build a defense against it (tolerance). This is called immunotherapy. Examples include: ? Allergy shot. You receive an injection that contains an allergen. ? Sublingual immunotherapy. You take a small dose of allergen under your tongue.  Emergency injection for anaphylaxis. You give yourself a shot using a syringe (auto-injector) that contains the amount of medicine you need. Your health care provider will teach you how to give yourself an injection.      Follow these instructions at home: Medicines  Take or apply over-the-counter and prescription medicines only as told by your health care provider.  Always carry your auto-injector pen if you are at risk of anaphylaxis. Give yourself an injection as told by your health care provider.   Eating and drinking  Follow instructions from your health care provider about eating or drinking restrictions.  Drink enough fluid to keep your urine pale yellow. General instructions  Wear a medical alert bracelet or necklace to let others know that you have had anaphylaxis before.  Avoid known allergens whenever possible.  Keep all follow-up visits as told by your health care provider. This is important. Contact a health care provider if:  Your symptoms do not get better with treatment. Get help right away if:  You have symptoms of anaphylaxis. These include: ? Swollen mouth, tongue, or throat. ? Pain or tightness in your chest. ? Trouble breathing or shortness of breath. ? Dizziness or fainting. ? Severe abdominal pain, vomiting, or diarrhea. These symptoms may represent a serious problem that is an emergency. Do not wait to see if the symptoms will go away. Get medical help right away. Call your local emergency services (911 in the [place] Do not drive yourself to  the hospital. Summary  Take or apply over-the-counter and prescription medicines only as told by your health care provider.  Avoid known allergens when possible.  Always carry your auto-injector pen if you are at risk of anaphylaxis. Give yourself an injection as told by your health care provider.  Wear a medical alert bracelet or necklace to let others know that you have had anaphylaxis before.  Anaphylaxis is a life-threatening emergency. Get help right away. This information is not intended to replace advice given to you by your health care provider. Make sure you discuss any questions you have with your health care provider. Document Revised: 01/23/2020 Document Reviewed: 04/06/2019 Elsevier Patient Education  2021 [employer].

## 2020-09-07 NOTE — Progress Notes (Signed)
Acute Office Visit  Subjective:    Patient ID: Kristen Larson, female    DOB: 06-13-1960, 60 y.o.   MRN: [id number]  Chief Complaint  Patient presents with  . Gout    Pt states she finished Prednisone Sunday and states her R foot started hurting again in the same spot last night  . Sinusitis    Pt states she has had a runny nose, drainage, and congestion for the last week     HPI Patient is in today for gout flare. She had a gout flare in the same spot 2 weeks ago. It resolved with the prednisone, however it came back again last night.  GOUT  Duration: 1 day Right 1st metatarsophalangeal pain: yes Left 1st metatarsophalangeal pain: no Right knee pain: no Left knee pain: no Severity: 6/10  Quality: sore Swelling: no Redness: yes Trauma: no Recent dietary change or indiscretion: no Fevers: no Nausea/vomiting: no Aggravating factors: Alleviating factors:  Status:  worse Treatments attempted: none  UPPER RESPIRATORY TRACT INFECTION  Started last weekend Worst symptom: cough Fever: no Cough: yes Shortness of breath: no Wheezing: no Chest pain: no Chest tightness: no Chest congestion: no Nasal congestion: yes Runny nose: yes Post nasal drip: yes Sneezing: no Sore throat: no Swollen glands: no Sinus pressure: no Headache: no Face pain: no Toothache: no Ear pain: no  Ear pressure: At the beginning, right ear was muffled Eyes red/itching:no Eye drainage/crusting: no  Vomiting: no Rash: no Fatigue: yes Sick contacts: no Strep contacts: no  Context: stable Recurrent sinusitis: no Relief with OTC cold/cough medications: no  Treatments attempted: Robitussin    Past Medical History:  Diagnosis Date  . Allergy   . GERD (gastroesophageal reflux disease)   . Gout   . Hypertension   . Thyroid disease     Past Surgical History:  Procedure Laterality Date  . CHOLECYSTECTOMY  05/25/14  . TONSILLECTOMY AND ADENOIDECTOMY      Family History   Problem Relation Age of Onset  . Heart disease Mother   . Thyroid disease Mother   . COPD Father   . Stroke Father   . Glaucoma Father   . Thyroid disease Sister   . Crohn's disease Brother   . Alzheimer's disease Maternal Grandmother   . Breast cancer Maternal Grandmother 54  . Heart disease Maternal Grandfather   . Lupus Paternal Grandmother   . Diabetes Paternal Grandfather   . Down syndrome Brother     Social History   Socioeconomic History  . Marital status: Married    Spouse name: Not on file  . Number of children: Not on file  . Years of education: Not on file  . Highest education level: Not on file  Occupational History  . Not on file  Tobacco Use  . Smoking status: Never Smoker  . Smokeless tobacco: Never Used  Vaping Use  . Vaping Use: Never used  Substance and Sexual Activity  . Alcohol use: No    Alcohol/week: 0.0 standard drinks  . Drug use: No  . Sexual activity: Yes  Other Topics Concern  . Not on file  Social History Narrative  . Not on file   Social Determinants of Health   Financial Resource Strain: Not on file  Food Insecurity: Not on file  Transportation Needs: Not on file  Physical Activity: Not on file  Stress: Not on file  Social Connections: Not on file  Intimate Partner Violence: Not on file    Outpatient  Medications Prior to Visit  Medication Sig Dispense Refill  . allopurinol (ZYLOPRIM) 100 MG tablet Take 1 tablet (100 mg total) by mouth daily. 90 tablet 4  . aspirin 81 MG tablet Take 81 mg by mouth daily.    . fluticasone (FLONASE) 50 MCG/ACT nasal spray SPRAY 2 SPRAYS INTO EACH NOSTRIL EVERY DAY 48 mL 1  . levothyroxine (SYNTHROID) 75 MCG tablet TAKE 1 TABLET(75 MCG) BY MOUTH DAILY BEFORE AND BREAKFAST 90 tablet 3  . lisinopril (ZESTRIL) 5 MG tablet Take 1 tablet (5 mg total) by mouth daily. 90 tablet 3  . Multiple Vitamin (MULTIVITAMIN) tablet Take 1 tablet by mouth daily.    . predniSONE (DELTASONE) 10 MG tablet Take 6  tablets by mouth daily for 2 days, then reduce by 1 tablet every 2 days until gone 42 tablet 0   No facility-administered medications prior to visit.    Allergies  Allergen Reactions  . Penicillins Rash    Review of Systems  HENT: Positive for congestion and postnasal drip. Negative for ear pain, sinus pressure and sore throat.   Respiratory: Negative.   Cardiovascular: Negative.   Gastrointestinal: Negative.   Genitourinary: Negative.   Neurological: Negative.   Psychiatric/Behavioral: Negative.        Objective:    Physical Exam Vitals and nursing note reviewed.  Constitutional:      General: She is not in acute distress.    Appearance: Normal appearance.  HENT:     Head: Normocephalic.     Right Ear: Ear canal and external ear normal. A middle ear effusion is present. Tympanic membrane is not erythematous or bulging.     Left Ear: Ear canal and external ear normal. A middle ear effusion is present. Tympanic membrane is not erythematous or bulging.  Eyes:     Conjunctiva/sclera: Conjunctivae normal.  Cardiovascular:     Rate and Rhythm: Normal rate and regular rhythm.     Pulses: Normal pulses.     Heart sounds: Normal heart sounds.  Pulmonary:     Effort: Pulmonary effort is normal.     Breath sounds: Normal breath sounds.  Abdominal:     Palpations: Abdomen is soft.  Musculoskeletal:     Cervical back: Normal range of motion and neck supple. No tenderness.  Lymphadenopathy:     Cervical: No cervical adenopathy.  Skin:    General: Skin is warm.     Findings: Erythema present.     Comments: Erythema and warmth to right foot, 1st metatarsal  Neurological:     General: No focal deficit present.     Mental Status: She is alert and oriented to person, place, and time.  Psychiatric:        Mood and Affect: Mood normal.        Behavior: Behavior normal.        Thought Content: Thought content normal.        Judgment: Judgment normal.     BP 109/66   Pulse 62    Temp 98.7 F (37.1 C) (Oral)   Wt 161 lb 3.2 oz (73.1 kg)   LMP  (LMP Unknown)   SpO2 98%   BMI 29.36 kg/m  Wt Readings from Last 3 Encounters:  09/07/20 161 lb 3.2 oz (73.1 kg)  07/13/20 164 lb (74.4 kg)  12/23/19 166 lb (75.3 kg)    Lab Results  Component Value Date   TSH 2.780 07/13/2020   Lab Results  Component Value Date   WBC 7.5  08/22/2020   HGB 15.2 08/22/2020   HCT 45.4 08/22/2020   MCV 85 08/22/2020   PLT 211 08/22/2020   Lab Results  Component Value Date   NA 141 08/22/2020   K 4.2 08/22/2020   CO2 22 08/22/2020   GLUCOSE 82 08/22/2020   BUN 14 08/22/2020   CREATININE 1.03 (H) 08/22/2020   BILITOT 1.0 08/22/2020   ALKPHOS 170 (H) 08/22/2020   AST 23 08/22/2020   ALT 27 08/22/2020   PROT 6.8 08/22/2020   ALBUMIN 4.4 08/22/2020   CALCIUM 9.5 08/22/2020   Lab Results  Component Value Date   CHOL 197 07/13/2020   Lab Results  Component Value Date   HDL 50 07/13/2020   Lab Results  Component Value Date   LDLCALC 127 (H) 07/13/2020   Lab Results  Component Value Date   TRIG 111 07/13/2020   Lab Results  Component Value Date   CHOLHDL 3.9 01/26/2018   Lab Results  Component Value Date   HGBA1C 5.4 12/23/2019       Assessment & Plan:   Problem List Items Addressed This Visit      Musculoskeletal and Integument   Acute idiopathic gout involving toe of right foot - Primary    Continue allopurinol. Treated with prednisone 2 weeks ago, symptoms resolved, then recurred. Will treat with colchicine. Follow-up if symptoms worsen or do not improve. Keep appointment at end of April for uric acid level check.       Relevant Medications   colchicine 0.6 MG tablet    Other Visit Diagnoses    Upper respiratory tract infection, unspecified type       Most likely viral/allergic rhinitis. Continue flonase. Discussed symptomatic treatment. Tessalon perles sent for cough prn. F/U if symptoms do not improve       Meds ordered this encounter   Medications  . colchicine 0.6 MG tablet    Sig: Take 2 tablets now, then 1 tablet 1 hour later    Dispense:  3 tablet    Refill:  0  . benzonatate (TESSALON) 100 MG capsule    Sig: Take 1 capsule (100 mg total) by mouth 2 (two) times daily as needed for cough.    Dispense:  20 capsule    Refill:  0     Gerre Scull, NP

## 2020-09-07 NOTE — Assessment & Plan Note (Signed)
Continue allopurinol. Treated with prednisone 2 weeks ago, symptoms resolved, then recurred. Will treat with colchicine. Follow-up if symptoms worsen or do not improve. Keep appointment at end of April for uric acid level check.

## 2020-09-12 ENCOUNTER — Telehealth: Payer: Self-pay | Admitting: Nurse Practitioner

## 2020-09-12 NOTE — Telephone Encounter (Signed)
Routing to provider  

## 2020-09-12 NOTE — Telephone Encounter (Signed)
Pt saw Lauren on 4/01 with gout in her right toe.  Pt was advised to call back if it did not get better. Pt states it is not any better. The medicine Lauren prescribed did even faze the gout.  Please advise.

## 2020-09-12 NOTE — Telephone Encounter (Signed)
Patient notified, appt scheduled.

## 2020-09-12 NOTE — Telephone Encounter (Signed)
Patient is calling back regarding her previous message.  She would like to hear from the nurse or doctor today.  CB# [phone]

## 2020-09-12 NOTE — Telephone Encounter (Signed)
Recommend if she is having ongoing pain to return to office for reassessment.  Also trial increasing Allopurinol to 300 MG daily.

## 2020-09-13 ENCOUNTER — Ambulatory Visit: Payer: 59 | Admitting: Nurse Practitioner

## 2020-09-13 ENCOUNTER — Encounter: Payer: Self-pay | Admitting: Nurse Practitioner

## 2020-09-13 ENCOUNTER — Other Ambulatory Visit: Payer: Self-pay

## 2020-09-13 VITALS — BP 116/78 | HR 67 | Temp 98.5°F | Ht 61.81 in | Wt 163.4 lb

## 2020-09-13 DIAGNOSIS — M10071 Idiopathic gout, right ankle and foot: Secondary | ICD-10-CM | POA: Diagnosis not present

## 2020-09-13 MED ORDER — PREDNISONE 10 MG PO TABS: ORAL_TABLET | ORAL | 0 refills | Status: DC

## 2020-09-13 NOTE — Progress Notes (Signed)
Acute Office Visit  Subjective:    Patient ID: Kristen Larson, female    DOB: March 07, 1961, 60 y.o.   MRN: [id number]  Chief Complaint  Patient presents with  . Gout    Has been dealing with this for the past month, has taken allopurinol and Colchicine.    HPI Patient is in today for gout. She was given prednisone and allopurinol a month ago. The gout pain and redness improved when with the prednisone, but then came back a couple days later. She was then given colchicine which did not help pain or redness at all. She denies fevers, drainage, chest pain, and shortness of breath.   Past Medical History:  Diagnosis Date  . Allergy   . GERD (gastroesophageal reflux disease)   . Gout   . Hypertension   . Thyroid disease     Past Surgical History:  Procedure Laterality Date  . CHOLECYSTECTOMY  05/25/14  . TONSILLECTOMY AND ADENOIDECTOMY      Family History  Problem Relation Age of Onset  . Heart disease Mother   . Thyroid disease Mother   . COPD Father   . Stroke Father   . Glaucoma Father   . Thyroid disease Sister   . Crohn's disease Brother   . Alzheimer's disease Maternal Grandmother   . Breast cancer Maternal Grandmother 52  . Heart disease Maternal Grandfather   . Lupus Paternal Grandmother   . Diabetes Paternal Grandfather   . Down syndrome Brother     Social History   Socioeconomic History  . Marital status: Married    Spouse name: Not on file  . Number of children: Not on file  . Years of education: Not on file  . Highest education level: Not on file  Occupational History  . Not on file  Tobacco Use  . Smoking status: Never Smoker  . Smokeless tobacco: Never Used  Vaping Use  . Vaping Use: Never used  Substance and Sexual Activity  . Alcohol use: No    Alcohol/week: 0.0 standard drinks  . Drug use: No  . Sexual activity: Yes  Other Topics Concern  . Not on file  Social History Narrative  . Not on file   Social Determinants of Health    Financial Resource Strain: Not on file  Food Insecurity: Not on file  Transportation Needs: Not on file  Physical Activity: Not on file  Stress: Not on file  Social Connections: Not on file  Intimate Partner Violence: Not on file    Outpatient Medications Prior to Visit  Medication Sig Dispense Refill  . allopurinol (ZYLOPRIM) 100 MG tablet Take 1 tablet (100 mg total) by mouth daily. 90 tablet 4  . aspirin 81 MG tablet Take 81 mg by mouth daily.    . benzonatate (TESSALON) 100 MG capsule Take 1 capsule (100 mg total) by mouth 2 (two) times daily as needed for cough. 20 capsule 0  . colchicine 0.6 MG tablet Take 2 tablets now, then 1 tablet 1 hour later 3 tablet 0  . fluticasone (FLONASE) 50 MCG/ACT nasal spray SPRAY 2 SPRAYS INTO EACH NOSTRIL EVERY DAY 48 mL 1  . levothyroxine (SYNTHROID) 75 MCG tablet TAKE 1 TABLET(75 MCG) BY MOUTH DAILY BEFORE AND BREAKFAST 90 tablet 3  . lisinopril (ZESTRIL) 5 MG tablet Take 1 tablet (5 mg total) by mouth daily. 90 tablet 3  . Multiple Vitamin (MULTIVITAMIN) tablet Take 1 tablet by mouth daily.     No facility-administered medications prior  to visit.    Allergies  Allergen Reactions  . Penicillins Rash    Review of Systems  Constitutional: Negative for chills and fatigue.  HENT: Negative.   Respiratory: Negative.   Cardiovascular: Negative.   Gastrointestinal: Negative.   Genitourinary: Negative.   Musculoskeletal: Positive for joint swelling (redness and pain to right great toe ).  Neurological: Negative.        Objective:    Physical Exam Vitals and nursing note reviewed.  Constitutional:      General: She is not in acute distress.    Appearance: Normal appearance.  HENT:     Head: Normocephalic.  Eyes:     Conjunctiva/sclera: Conjunctivae normal.  Musculoskeletal:        General: Tenderness present.     Comments: Great toe metatarsal with redness and edema  Skin:    Findings: Erythema (right great toe metatarsal)  present.  Neurological:     Mental Status: She is alert and oriented to person, place, and time.  Psychiatric:        Mood and Affect: Mood normal.        Behavior: Behavior normal.        Thought Content: Thought content normal.        Judgment: Judgment normal.     BP 116/78   Pulse 67   Temp 98.5 F (36.9 C) (Oral)   Ht 5' 1.81" (1.57 m)   Wt 163 lb 6.4 oz (74.1 kg)   LMP  (LMP Unknown)   SpO2 98%   BMI 30.07 kg/m  Wt Readings from Last 3 Encounters:  09/13/20 163 lb 6.4 oz (74.1 kg)  09/07/20 161 lb 3.2 oz (73.1 kg)  07/13/20 164 lb (74.4 kg)     Lab Results  Component Value Date   TSH 2.780 07/13/2020   Lab Results  Component Value Date   WBC 7.5 08/22/2020   HGB 15.2 08/22/2020   HCT 45.4 08/22/2020   MCV 85 08/22/2020   PLT 211 08/22/2020   Lab Results  Component Value Date   NA 141 08/22/2020   K 4.2 08/22/2020   CO2 22 08/22/2020   GLUCOSE 82 08/22/2020   BUN 14 08/22/2020   CREATININE 1.03 (H) 08/22/2020   BILITOT 1.0 08/22/2020   ALKPHOS 170 (H) 08/22/2020   AST 23 08/22/2020   ALT 27 08/22/2020   PROT 6.8 08/22/2020   ALBUMIN 4.4 08/22/2020   CALCIUM 9.5 08/22/2020   Lab Results  Component Value Date   CHOL 197 07/13/2020   Lab Results  Component Value Date   HDL 50 07/13/2020   Lab Results  Component Value Date   LDLCALC 127 (H) 07/13/2020   Lab Results  Component Value Date   TRIG 111 07/13/2020   Lab Results  Component Value Date   CHOLHDL 3.9 01/26/2018   Lab Results  Component Value Date   HGBA1C 5.4 12/23/2019       Assessment & Plan:   Problem List Items Addressed This Visit      Musculoskeletal and Integument   Acute idiopathic gout involving toe of right foot - Primary    Colchicine did not help the gout any. Prednisone prescription sent to pharmacy and ensured she had refills available for allopurinol. Has uric acid level ordered for 10/02/20. Follow-up if symptoms worsen or do not improve.      Relevant  Medications   predniSONE (DELTASONE) 10 MG tablet       Meds ordered this encounter  Medications  . predniSONE (DELTASONE) 10 MG tablet    Sig: Take 6 tablets today and tomorrow, then 5 tablets the next 2 days, then decrease by 1 tablet every other day until gone    Dispense:  42 tablet    Refill:  0     Gerre Scull, NP

## 2020-09-13 NOTE — Assessment & Plan Note (Addendum)
Colchicine did not help the gout any. Prednisone prescription sent to pharmacy and ensured she had refills available for allopurinol. Has uric acid level ordered for 10/02/20. Follow-up if symptoms worsen or do not improve.

## 2020-09-13 NOTE — Patient Instructions (Signed)
Gout  Gout is a condition that causes painful swelling of the joints. Gout is a type of inflammation of the joints (arthritis). This condition is caused by having too much uric acid in the body. Uric acid is a chemical that forms when the body breaks down substances called purines. Purines are important for building body proteins. When the body has too much uric acid, sharp crystals can form and build up inside the joints. This causes pain and swelling. Gout attacks can happen quickly and may be very painful (acute gout). Over time, the attacks can affect more joints and become more frequent (chronic gout). Gout can also cause uric acid to build up under the skin and inside the kidneys. What are the causes? This condition is caused by too much uric acid in your blood. This can happen because:  Your kidneys do not remove enough uric acid from your blood. This is the most common cause.  Your body makes too much uric acid. This can happen with some cancers and cancer treatments. It can also occur if your body is breaking down too many red blood cells (hemolytic anemia).  You eat too many foods that are high in purines. These foods include organ meats and some seafood. Alcohol, especially beer, is also high in purines. A gout attack may be triggered by trauma or stress. What increases the risk? You are more likely to develop this condition if you:  Have a family history of gout.  Are female and middle-aged.  Are female and have gone through menopause.  Are obese.  Frequently drink alcohol, especially beer.  Are dehydrated.  Lose weight too quickly.  Have an organ transplant.  Have lead poisoning.  Take certain medicines, including aspirin, cyclosporine, diuretics, levodopa, and niacin.  Have kidney disease.  Have a skin condition called psoriasis. What are the signs or symptoms? An attack of acute gout happens quickly. It usually occurs in just one joint. The most common place is  the big toe. Attacks often start at night. Other joints that may be affected include joints of the feet, ankle, knee, fingers, wrist, or elbow. Symptoms of this condition may include:  Severe pain.  Warmth.  Swelling.  Stiffness.  Tenderness. The affected joint may be very painful to touch.  Shiny, red, or purple skin.  Chills and fever. Chronic gout may cause symptoms more frequently. More joints may be involved. You may also have white or yellow lumps (tophi) on your hands or feet or in other areas near your joints.   How is this diagnosed? This condition is diagnosed based on your symptoms, medical history, and physical exam. You may have tests, such as:  Blood tests to measure uric acid levels.  Removal of joint fluid with a thin needle (aspiration) to look for uric acid crystals.  X-rays to look for joint damage. How is this treated? Treatment for this condition has two phases: treating an acute attack and preventing future attacks. Acute gout treatment may include medicines to reduce pain and swelling, including:  NSAIDs.  Steroids. These are strong anti-inflammatory medicines that can be taken by mouth (orally) or injected into a joint.  Colchicine. This medicine relieves pain and swelling when it is taken soon after an attack. It can be given by mouth or through an IV. Preventive treatment may include:  Daily use of smaller doses of NSAIDs or colchicine.  Use of a medicine that reduces uric acid levels in your blood.  Changes to your diet.   You may need to see a dietitian about what to eat and drink to prevent gout. Follow these instructions at home: During a gout attack  If directed, put ice on the affected area: ? Put ice in a plastic bag. ? Place a towel between your skin and the bag. ? Leave the ice on for 20 minutes, 2-3 times a day.  Raise (elevate) the affected joint above the level of your heart as often as possible.  Rest the joint as much as possible.  If the affected joint is in your leg, you may be given crutches to use.  Follow instructions from your health care provider about eating or drinking restrictions.   Avoiding future gout attacks  Follow a low-purine diet as told by your dietitian or health care provider. Avoid foods and drinks that are high in purines, including liver, kidney, anchovies, asparagus, herring, mushrooms, mussels, and beer.  Maintain a healthy weight or lose weight if you are overweight. If you want to lose weight, talk with your health care provider. It is important that you do not lose weight too quickly.  Start or maintain an exercise program as told by your health care provider. Eating and drinking  Drink enough fluids to keep your urine pale yellow.  If you drink alcohol: ? Limit how much you use to:  0-1 drink a day for women.  0-2 drinks a day for men. ? Be aware of how much alcohol is in your drink. In the [place] one drink equals one 12 oz bottle of beer (355 mL) one 5 oz glass of wine (148 mL), or one 1 oz glass of hard liquor (44 mL). General instructions  Take over-the-counter and prescription medicines only as told by your health care provider.  Do not drive or use heavy machinery while taking prescription pain medicine.  Return to your normal activities as told by your health care provider. Ask your health care provider what activities are safe for you.  Keep all follow-up visits as told by your health care provider. This is important. Contact a health care provider if you have:  Another gout attack.  Continuing symptoms of a gout attack after 10 days of treatment.  Side effects from your medicines.  Chills or a fever.  Burning pain when you urinate.  Pain in your lower back or belly. Get help right away if you:  Have severe or uncontrolled pain.  Cannot urinate. Summary  Gout is painful swelling of the joints caused by inflammation.  The most common site of pain is the big  toe, but it can affect other joints in the body.  Medicines and dietary changes can help to prevent and treat gout attacks. This information is not intended to replace advice given to you by your health care provider. Make sure you discuss any questions you have with your health care provider. Document Revised: 12/16/2017 Document Reviewed: 12/16/2017 Elsevier Patient Education  2021 [employer]  

## 2020-10-01 ENCOUNTER — Telehealth: Payer: Self-pay

## 2020-10-01 NOTE — Telephone Encounter (Signed)
Please advise pt seen 4/7 by lauren  Copied from CRM #[id number]. Topic: General - Other >> Oct 01, 2020  9:55 AM Jaquita Rector A wrote: Reason for CRM: Patient called in asking to speak to a nurse say that her gout is acting up Please call Ph# [phone]

## 2020-10-01 NOTE — Telephone Encounter (Signed)
Lvm to schedule

## 2020-10-02 ENCOUNTER — Other Ambulatory Visit: Payer: Self-pay

## 2020-10-02 ENCOUNTER — Ambulatory Visit: Payer: 59 | Admitting: Nurse Practitioner

## 2020-10-02 ENCOUNTER — Encounter: Payer: Self-pay | Admitting: Nurse Practitioner

## 2020-10-02 VITALS — BP 131/81 | HR 81 | Temp 99.1°F | Wt 162.2 lb

## 2020-10-02 DIAGNOSIS — M79671 Pain in right foot: Secondary | ICD-10-CM | POA: Diagnosis not present

## 2020-10-02 DIAGNOSIS — R748 Abnormal levels of other serum enzymes: Secondary | ICD-10-CM | POA: Diagnosis not present

## 2020-10-02 DIAGNOSIS — M10071 Idiopathic gout, right ankle and foot: Secondary | ICD-10-CM

## 2020-10-02 MED ORDER — DOXYCYCLINE HYCLATE 100 MG PO TABS: 100.0000 mg | ORAL_TABLET | Freq: Two times a day (BID) | ORAL | 0 refills | Status: DC

## 2020-10-02 NOTE — Assessment & Plan Note (Signed)
Right foot redness and pain that started on 08/22/20 thought to be gout and treated with several courses of prednisone and colchicine. With recurrent symptoms and new swelling, warmth and redness of her foot, will treat with doxycyline. Differentials include gout, pseudo-gout, arthritis, cellulitis. Will check CMP, CBC, ANA, ESR, CRP. Referral placed for podiatry. Follow-up and treat based on lab results.

## 2020-10-02 NOTE — Telephone Encounter (Signed)
Pt stated she would call to make this apt.  

## 2020-10-02 NOTE — Assessment & Plan Note (Signed)
Check uric acid level today 

## 2020-10-02 NOTE — Progress Notes (Addendum)
Acute Office Visit  Subjective:    Patient ID: Kristen Larson, female    DOB: 1960/06/25, 60 y.o.   MRN: [id number]  Chief Complaint  Patient presents with  . Gout    Pt states she is still having gout in her R big toe. States the prednisone helped and once she finished it, the pain came back.     HPI   Patient is in today for right foot pain and redness. She started with an episode of this pain and potential gout flare on 08/22/20. She was treated with a prednisone taper and placed on allopurinol since this was not her first gout flare. The pain and redness resolved and then came back when she stopped the prednisone. She was seen on 09/07/20 and was treated with colchicine. That medicine did not help at all and on 09/13/20 she was started on another prednisone taper. The pain and redness subsided but again returned after prednisone completion. Today she is having pain and redness that she describes as dull, aching, and throbbing. It is a 6/10. There is also swelling and warmth in her foot that started today. Denies fevers, shortness of breath, and chest pain.    Past Medical History:  Diagnosis Date  . Allergy   . GERD (gastroesophageal reflux disease)   . Gout   . Hypertension   . Thyroid disease     Past Surgical History:  Procedure Laterality Date  . CHOLECYSTECTOMY  05/25/14  . TONSILLECTOMY AND ADENOIDECTOMY      Family History  Problem Relation Age of Onset  . Heart disease Mother   . Thyroid disease Mother   . COPD Father   . Stroke Father   . Glaucoma Father   . Thyroid disease Sister   . Crohn's disease Brother   . Alzheimer's disease Maternal Grandmother   . Breast cancer Maternal Grandmother 58  . Heart disease Maternal Grandfather   . Lupus Paternal Grandmother   . Diabetes Paternal Grandfather   . Down syndrome Brother     Social History   Socioeconomic History  . Marital status: Married    Spouse name: Not on file  . Number of children: Not on file   . Years of education: Not on file  . Highest education level: Not on file  Occupational History  . Not on file  Tobacco Use  . Smoking status: Never Smoker  . Smokeless tobacco: Never Used  Vaping Use  . Vaping Use: Never used  Substance and Sexual Activity  . Alcohol use: No    Alcohol/week: 0.0 standard drinks  . Drug use: No  . Sexual activity: Yes  Other Topics Concern  . Not on file  Social History Narrative  . Not on file   Social Determinants of Health   Financial Resource Strain: Not on file  Food Insecurity: Not on file  Transportation Needs: Not on file  Physical Activity: Not on file  Stress: Not on file  Social Connections: Not on file  Intimate Partner Violence: Not on file    Outpatient Medications Prior to Visit  Medication Sig Dispense Refill  . allopurinol (ZYLOPRIM) 100 MG tablet Take 1 tablet (100 mg total) by mouth daily. 90 tablet 4  . aspirin 81 MG tablet Take 81 mg by mouth daily.    . benzonatate (TESSALON) 100 MG capsule Take 1 capsule (100 mg total) by mouth 2 (two) times daily as needed for cough. 20 capsule 0  . fluticasone (FLONASE) 50  MCG/ACT nasal spray SPRAY 2 SPRAYS INTO EACH NOSTRIL EVERY DAY 48 mL 1  . levothyroxine (SYNTHROID) 75 MCG tablet TAKE 1 TABLET(75 MCG) BY MOUTH DAILY BEFORE AND BREAKFAST 90 tablet 3  . lisinopril (ZESTRIL) 5 MG tablet Take 1 tablet (5 mg total) by mouth daily. 90 tablet 3  . Multiple Vitamin (MULTIVITAMIN) tablet Take 1 tablet by mouth daily.    . colchicine 0.6 MG tablet Take 2 tablets now, then 1 tablet 1 hour later (Patient not taking: Reported on 10/02/2020) 3 tablet 0  . predniSONE (DELTASONE) 10 MG tablet Take 6 tablets today and tomorrow, then 5 tablets the next 2 days, then decrease by 1 tablet every other day until gone 42 tablet 0   No facility-administered medications prior to visit.    Allergies  Allergen Reactions  . Penicillins Rash    Review of Systems  Constitutional: Negative.    Respiratory: Negative.   Cardiovascular: Negative.   Gastrointestinal: Negative.   Musculoskeletal: Positive for joint swelling (right first metatarsal on her foot).  Neurological: Negative.        Objective:    Physical Exam Vitals and nursing note reviewed.  Constitutional:      General: She is not in acute distress.    Appearance: Normal appearance.  HENT:     Head: Normocephalic.  Eyes:     Conjunctiva/sclera: Conjunctivae normal.  Cardiovascular:     Rate and Rhythm: Normal rate and regular rhythm.     Pulses: Normal pulses.     Heart sounds: Normal heart sounds.  Pulmonary:     Effort: Pulmonary effort is normal.     Breath sounds: Normal breath sounds.  Musculoskeletal:     Cervical back: Normal range of motion.     Right lower leg: Edema (right pedal edema) present.  Skin:    General: Skin is warm.     Findings: Erythema (right first metatarsal and top of her right foot) present.  Neurological:     General: No focal deficit present.     Mental Status: She is alert and oriented to person, place, and time.  Psychiatric:        Mood and Affect: Mood normal.        Behavior: Behavior normal.        Thought Content: Thought content normal.        Judgment: Judgment normal.     BP 131/81   Pulse 81   Temp 99.1 F (37.3 C) (Oral)   Wt 162 lb 3.2 oz (73.6 kg)   LMP  (LMP Unknown)   SpO2 99%   BMI 29.85 kg/m  Wt Readings from Last 3 Encounters:  10/02/20 162 lb 3.2 oz (73.6 kg)  09/13/20 163 lb 6.4 oz (74.1 kg)  09/07/20 161 lb 3.2 oz (73.1 kg)    There are no preventive care reminders to display for this patient.  There are no preventive care reminders to display for this patient.   Lab Results  Component Value Date   TSH 2.780 07/13/2020   Lab Results  Component Value Date   WBC 9.2 10/02/2020   HGB 15.8 10/02/2020   HCT 47.8 (H) 10/02/2020   MCV 86 10/02/2020   PLT 183 10/02/2020   Lab Results  Component Value Date   NA 140 10/02/2020    K 4.3 10/02/2020   CO2 22 10/02/2020   GLUCOSE 85 10/02/2020   BUN 13 10/02/2020   CREATININE 1.06 (H) 10/02/2020   BILITOT 0.7 10/02/2020  ALKPHOS 187 (H) 10/02/2020   AST 64 (H) 10/02/2020   ALT 78 (H) 10/02/2020   PROT 7.0 10/02/2020   ALBUMIN 4.4 10/02/2020   CALCIUM 9.7 10/02/2020   EGFR 61 10/02/2020   Lab Results  Component Value Date   CHOL 197 07/13/2020   Lab Results  Component Value Date   HDL 50 07/13/2020   Lab Results  Component Value Date   LDLCALC 127 (H) 07/13/2020   Lab Results  Component Value Date   TRIG 111 07/13/2020   Lab Results  Component Value Date   CHOLHDL 3.9 01/26/2018   Lab Results  Component Value Date   HGBA1C 5.4 12/23/2019       Assessment & Plan:   Problem List Items Addressed This Visit      Musculoskeletal and Integument   Acute idiopathic gout involving toe of right foot    Check uric acid level today.      Relevant Orders   Ambulatory referral to Podiatry     Other   Elevated alkaline phosphatase level    Check CMP and Gamma GT today.       Relevant Orders   Gamma GT (Completed)   US Abdomen Limited RUQ (LIVER/GB)   Right foot pain - Primary    Right foot redness and pain that started on 08/22/20 thought to be gout and treated with several courses of prednisone and colchicine. With recurrent symptoms and new swelling, warmth and redness of her foot, will treat with doxycyline. Differentials include gout, pseudo-gout, arthritis, cellulitis. Will check CMP, CBC, ANA, ESR, CRP. Referral placed for podiatry. Follow-up and treat based on lab results.       Relevant Orders   Comp Met (CMET) (Completed)   CBC w/Diff (Completed)   Sed Rate (ESR) (Completed)   C-reactive protein (Completed)   Uric acid (Completed)   Antinuclear Antib (ANA) (Completed)   Ambulatory referral to [facility] ordered this encounter  Medications  . doxycycline (VIBRA-TABS) 100 MG tablet    Sig: Take 1 tablet (100 mg  total) by mouth 2 (two) times daily.    Dispense:  20 tablet    Refill:  0     Charyl Dancer, NP

## 2020-10-02 NOTE — Patient Instructions (Signed)
It was great to see you!  We will call you with your lab results. Start doxycycline twice a day for 10 days. You should hear from the podiatrist about scheduling an appointment.   If a referral was placed today, you will be contacted for an appointment. Please note that routine referrals can sometimes take up to 3-4 weeks to process. Please call our office if you haven't heard anything after this time frame.  Take care,  Rodman Pickle, NP

## 2020-10-02 NOTE — Assessment & Plan Note (Signed)
Check CMP and Gamma GT today.

## 2020-10-03 ENCOUNTER — Other Ambulatory Visit: Payer: Self-pay

## 2020-10-03 LAB — COMPREHENSIVE METABOLIC PANEL
ALT: 78 IU/L — ABNORMAL HIGH (ref 0–32)
AST: 64 IU/L — ABNORMAL HIGH (ref 0–40)
Albumin/Globulin Ratio: 1.7 (ref 1.2–2.2)
Albumin: 4.4 g/dL (ref 3.8–4.9)
Alkaline Phosphatase: 187 IU/L — ABNORMAL HIGH (ref 44–121)
BUN/Creatinine Ratio: 12 (ref 9–23)
BUN: 13 mg/dL (ref 6–24)
Bilirubin Total: 0.7 mg/dL (ref 0.0–1.2)
CO2: 22 mmol/L (ref 20–29)
Calcium: 9.7 mg/dL (ref 8.7–10.2)
Chloride: 101 mmol/L (ref 96–106)
Creatinine, Ser: 1.06 mg/dL — ABNORMAL HIGH (ref 0.57–1.00)
Globulin, Total: 2.6 g/dL (ref 1.5–4.5)
Glucose: 85 mg/dL (ref 65–99)
Potassium: 4.3 mmol/L (ref 3.5–5.2)
Sodium: 140 mmol/L (ref 134–144)
Total Protein: 7 g/dL (ref 6.0–8.5)
eGFR: 61 mL/min/{1.73_m2} (ref >59)

## 2020-10-03 LAB — CBC WITH DIFFERENTIAL/PLATELET
Basophils Absolute: 0.1 10*3/uL (ref 0.0–0.2)
Basos: 1 %
EOS (ABSOLUTE): 0.1 10*3/uL (ref 0.0–0.4)
Eos: 1 %
Hematocrit: 47.8 % — ABNORMAL HIGH (ref 34.0–46.6)
Hemoglobin: 15.8 g/dL (ref 11.1–15.9)
Immature Grans (Abs): 0.1 10*3/uL (ref 0.0–0.1)
Immature Granulocytes: 2 %
Lymphocytes Absolute: 1.5 10*3/uL (ref 0.7–3.1)
Lymphs: 17 %
MCH: 28.3 pg (ref 26.6–33.0)
MCHC: 33.1 g/dL (ref 31.5–35.7)
MCV: 86 fL (ref 79–97)
Monocytes Absolute: 0.7 10*3/uL (ref 0.1–0.9)
Monocytes: 7 %
Neutrophils Absolute: 6.7 10*3/uL (ref 1.4–7.0)
Neutrophils: 72 %
Platelets: 183 10*3/uL (ref 150–450)
RBC: 5.59 x10E6/uL — ABNORMAL HIGH (ref 3.77–5.28)
RDW: 12.7 % (ref 11.7–15.4)
WBC: 9.2 10*3/uL (ref 3.4–10.8)

## 2020-10-03 LAB — ANA: Anti Nuclear Antibody (ANA): NEGATIVE

## 2020-10-03 LAB — URIC ACID: Uric Acid: 5.5 mg/dL (ref 3.0–7.2)

## 2020-10-03 LAB — SEDIMENTATION RATE: Sed Rate: 8 mm/hr (ref 0–40)

## 2020-10-03 LAB — GAMMA GT: GGT: 211 IU/L — ABNORMAL HIGH (ref 0–60)

## 2020-10-03 LAB — C-REACTIVE PROTEIN: CRP: 15 mg/L — ABNORMAL HIGH (ref 0–10)

## 2020-10-04 NOTE — Addendum Note (Signed)
Addended by: Rodman Pickle A on: 10/04/2020 11:32 AM   Modules accepted: Orders

## 2020-10-08 ENCOUNTER — Ambulatory Visit: Payer: 59 | Admitting: Podiatry

## 2020-10-08 ENCOUNTER — Encounter: Payer: Self-pay | Admitting: Podiatry

## 2020-10-08 ENCOUNTER — Other Ambulatory Visit: Payer: Self-pay

## 2020-10-08 ENCOUNTER — Ambulatory Visit (INDEPENDENT_AMBULATORY_CARE_PROVIDER_SITE_OTHER): Payer: 59

## 2020-10-08 DIAGNOSIS — M10071 Idiopathic gout, right ankle and foot: Secondary | ICD-10-CM

## 2020-10-08 DIAGNOSIS — M10079 Idiopathic gout, unspecified ankle and foot: Secondary | ICD-10-CM

## 2020-10-08 DIAGNOSIS — M10072 Idiopathic gout, left ankle and foot: Secondary | ICD-10-CM

## 2020-10-08 MED ORDER — INDOMETHACIN 50 MG PO CAPS: 50.0000 mg | ORAL_CAPSULE | Freq: Three times a day (TID) | ORAL | 0 refills | Status: AC

## 2020-10-08 NOTE — Progress Notes (Signed)
  Subjective:  Patient ID: Kristen Larson, female    DOB: 1960-06-28,  MRN: [id number]  Chief Complaint  Patient presents with  . Foot Pain    Patient presents today for bilat hallux and top of foot pain and swelling x 1 month.  She says she was being treated for gout, but the pain and swelling keeps returning.  She is currently on Doxycycline    60 y.o. female presents with the above complaint. History confirmed with patient.  Her gout is managed by her PCP she is on allopurinol.  Her uric acid has come down and is normalized now.  Objective:  Physical Exam: warm, good capillary refill, no trophic changes or ulcerative lesions, normal DP and PT pulses and normal sensory exam.  Bilateral has redness warmth and pain and swelling centered around the first metatarsophalangeal joint Radiographs: X-ray of both feet: no fracture, dislocation, swelling or degenerative changes noted Assessment:   1. Acute idiopathic gout of right foot   2. Acute idiopathic gout of left foot      Plan:  Patient was evaluated and treated and all questions answered.  Discussed with her that I still think this is gout.  Her chronic control has improved significantly with normalization of her uric acid on allopurinol.  I think she is still in an acute flare.  She is tried colchicine and a steroid taper.  I recommended indomethacin which I prescribed for her for 1 week.  Also recommended injection of corticosteroid into the joint with aspiration. After sterile prep with Betadine I attempted to aspirate fluid from the left first metatarsophalangeal joint but could not aspirate any.  Bilaterally 20 mg of Kenalog was injected directly into the joint.  She tolerated this well and was dressed with a bandage.  Return if symptoms worsen or fail to improve.

## 2020-10-22 ENCOUNTER — Encounter: Payer: Self-pay | Admitting: Nurse Practitioner

## 2020-10-22 ENCOUNTER — Ambulatory Visit: Payer: 59 | Admitting: Nurse Practitioner

## 2020-10-22 ENCOUNTER — Other Ambulatory Visit: Payer: Self-pay

## 2020-10-22 VITALS — BP 126/75 | HR 81 | Temp 98.8°F | Wt 158.2 lb

## 2020-10-22 DIAGNOSIS — R399 Unspecified symptoms and signs involving the genitourinary system: Secondary | ICD-10-CM | POA: Insufficient documentation

## 2020-10-22 DIAGNOSIS — R8281 Pyuria: Secondary | ICD-10-CM | POA: Diagnosis not present

## 2020-10-22 MED ORDER — NITROFURANTOIN MONOHYD MACRO 100 MG PO CAPS: 100.0000 mg | ORAL_CAPSULE | Freq: Two times a day (BID) | ORAL | 0 refills | Status: AC

## 2020-10-22 NOTE — Progress Notes (Signed)
BP 126/75   Pulse 81   Temp 98.8 F (37.1 C) (Oral)   Wt 158 lb 3.2 oz (71.8 kg)   LMP  (LMP Unknown)   SpO2 97%   BMI 29.11 kg/m    Subjective:    Patient ID: Kristen Larson, female    DOB: 19-Sep-1960, 60 y.o.   MRN: [id number]  HPI: Kristen Larson is a 60 y.o. female  Chief Complaint  Patient presents with  . Urinary Tract Infection    Patient states she is having burning with urinating and when she wiped she has some pinkish blood on her tissue. Patient states she has been on a lot of medications recently for a gout flare and does not know if that could be the cause of her possible UTI symptoms. Patient states she became symptomatic yesterday.   URINARY SYMPTOMS Started yesterday when going to restroom.  Noticed a little pink on tissue + is burning -- right after she goes. Dysuria: burning Urinary frequency: yes Urgency: no Small volume voids: no Symptom severity: yes Urinary incontinence: no Foul odor: no Hematuria: yes Abdominal pain: no Back pain: no Suprapubic pain/pressure: yes Flank pain: no Fever:  no Vomiting: no Relief with cranberry juice: none taken Relief with pyridium: none taken Status: stable Previous urinary tract infection: yes Recurrent urinary tract infection: no Sexual activity: monogomous History of sexually transmitted disease: no Treatments attempted: increasing fluids   Relevant past medical, surgical, family and social history reviewed and updated as indicated. Interim medical history since our last visit reviewed. Allergies and medications reviewed and updated.  Review of Systems  Constitutional: Negative for activity change, appetite change, diaphoresis, fatigue and fever.  Respiratory: Negative for cough, chest tightness and shortness of breath.   Cardiovascular: Negative for chest pain, palpitations and leg swelling.  Gastrointestinal: Negative.   Genitourinary: Positive for dysuria, frequency, hematuria and urgency. Negative  for decreased urine volume, difficulty urinating, flank pain, pelvic pain and vaginal discharge.  Neurological: Negative.   Psychiatric/Behavioral: Negative.     Per HPI unless specifically indicated above     Objective:    BP 126/75   Pulse 81   Temp 98.8 F (37.1 C) (Oral)   Wt 158 lb 3.2 oz (71.8 kg)   LMP  (LMP Unknown)   SpO2 97%   BMI 29.11 kg/m   Wt Readings from Last 3 Encounters:  10/22/20 158 lb 3.2 oz (71.8 kg)  10/02/20 162 lb 3.2 oz (73.6 kg)  09/13/20 163 lb 6.4 oz (74.1 kg)    Physical Exam Vitals and nursing note reviewed.  Constitutional:      General: She is awake. She is not in acute distress.    Appearance: She is well-developed, well-groomed and overweight. She is not ill-appearing.  HENT:     Head: Normocephalic.     Right Ear: Hearing normal.     Left Ear: Hearing normal.  Eyes:     General: Lids are normal.        Right eye: No discharge.        Left eye: No discharge.     Conjunctiva/sclera: Conjunctivae normal.     Pupils: Pupils are equal, round, and reactive to light.  Neck:     Thyroid: No thyromegaly.     Vascular: No carotid bruit.  Cardiovascular:     Rate and Rhythm: Normal rate and regular rhythm.     Heart sounds: Normal heart sounds. No murmur heard. No gallop.   Pulmonary:  Effort: Pulmonary effort is normal.     Breath sounds: Normal breath sounds.  Abdominal:     General: Bowel sounds are normal. There is no distension.     Palpations: Abdomen is soft. There is no hepatomegaly.     Tenderness: There is no abdominal tenderness. There is no right CVA tenderness or left CVA tenderness.  Musculoskeletal:     Cervical back: Normal range of motion and neck supple.     Right lower leg: No edema.     Left lower leg: No edema.  Lymphadenopathy:     Cervical: No cervical adenopathy.  Skin:    General: Skin is warm and dry.  Neurological:     Mental Status: She is alert and oriented to person, place, and time.  Psychiatric:         Attention and Perception: Attention normal.        Mood and Affect: Mood normal.        Speech: Speech normal.        Behavior: Behavior normal. Behavior is cooperative.        Thought Content: Thought content normal.    Results for orders placed or performed in visit on 10/02/20  Comp Met (CMET)  Result Value Ref Range   Glucose 85 65 - 99 mg/dL   BUN 13 6 - 24 mg/dL   Creatinine, Ser 1.06 (H) 0.57 - 1.00 mg/dL   eGFR 61 >59 mL/min/1.73   BUN/Creatinine Ratio 12 9 - 23   Sodium 140 134 - 144 mmol/L   Potassium 4.3 3.5 - 5.2 mmol/L   Chloride 101 96 - 106 mmol/L   CO2 22 20 - 29 mmol/L   Calcium 9.7 8.7 - 10.2 mg/dL   Total Protein 7.0 6.0 - 8.5 g/dL   Albumin 4.4 3.8 - 4.9 g/dL   Globulin, Total 2.6 1.5 - 4.5 g/dL   Albumin/Globulin Ratio 1.7 1.2 - 2.2   Bilirubin Total 0.7 0.0 - 1.2 mg/dL   Alkaline Phosphatase 187 (H) 44 - 121 IU/L   AST 64 (H) 0 - 40 IU/L   ALT 78 (H) 0 - 32 IU/L  CBC w/Diff  Result Value Ref Range   WBC 9.2 3.4 - 10.8 x10E3/uL   RBC 5.59 (H) 3.77 - 5.28 x10E6/uL   Hemoglobin 15.8 11.1 - 15.9 g/dL   Hematocrit 47.8 (H) 34.0 - 46.6 %   MCV 86 79 - 97 fL   MCH 28.3 26.6 - 33.0 pg   MCHC 33.1 31.5 - 35.7 g/dL   RDW 12.7 11.7 - 15.4 %   Platelets 183 150 - 450 x10E3/uL   Neutrophils 72 Not Estab. %   Lymphs 17 Not Estab. %   Monocytes 7 Not Estab. %   Eos 1 Not Estab. %   Basos 1 Not Estab. %   Neutrophils Absolute 6.7 1.4 - 7.0 x10E3/uL   Lymphocytes Absolute 1.5 0.7 - 3.1 x10E3/uL   Monocytes Absolute 0.7 0.1 - 0.9 x10E3/uL   EOS (ABSOLUTE) 0.1 0.0 - 0.4 x10E3/uL   Basophils Absolute 0.1 0.0 - 0.2 x10E3/uL   Immature Granulocytes 2 Not Estab. %   Immature Grans (Abs) 0.1 0.0 - 0.1 x10E3/uL  Sed Rate (ESR)  Result Value Ref Range   Sed Rate 8 0 - 40 mm/hr  C-reactive protein  Result Value Ref Range   CRP 15 (H) 0 - 10 mg/L  Uric acid  Result Value Ref Range   Uric Acid 5.5 3.0 - 7.2 mg/dL  Antinuclear Antib (ANA)  Result Value Ref  Range   Anti Nuclear Antibody (ANA) Negative Negative  Gamma GT  Result Value Ref Range   GGT 211 (H) 0 - 60 IU/L      Assessment & Plan:   Problem List Items Addressed This Visit      Other   Urinary symptom or sign - Primary    Acute x 24 hours.  UA noting 3+ blood and 1+ leuks, 11-30 RBC.  Wet prep negative.  Will send in Hawkins to start due to symptoms and findings. Send urine for culture -- discussed with patient if any abnormal findings or need to change abx regimen will alert her.  Recommend increased hydration and Azo at home.  Return in 3 weeks to recheck urine.      Relevant Orders   Urinalysis, Routine w reflex microscopic   WET PREP FOR TRICH, YEAST, CLUE    Other Visit Diagnoses    Pyuria       Send urine for culture   Relevant Orders   Urine Culture       Follow up plan: Return in about 3 weeks (around 11/12/2020) for UTI with hematuria.

## 2020-10-22 NOTE — Patient Instructions (Signed)
Obstetrics: Normal and problem pregnancies (7th ed., pp. 850-861). [place], [place]: Elsevier."> [facility] (12th ed., pp. 1129-1201). [place], [place]: Elsevier.">  Asymptomatic Bacteriuria Asymptomatic bacteriuria is the presence of a large number of bacteria in the urine without the usual symptoms of burning or frequent urination. This is usually not harmful, and treatment may not be needed. A person with this condition will not be more likely to develop an infection in the future. What are the causes? This condition is caused by an increase in bacteria in the urine. This increase can be caused by:  Bacteria entering the urinary tract, such as during sex.  A blockage in the urinary tract, such as from kidney stones or a tumor.  Bladder problems that prevent the bladder from emptying.   What increases the risk? You are more likely to develop this condition if:  You have diabetes.  You are an older adult. This especially affects older adults in long-term care facilities.  You are pregnant and in the first trimester.  You have kidney stones.  You are female.  You have had a kidney transplant.  You have a leaky kidney tube valve (reflux).  You had a urinary catheter for a long period of time. This is a long, thin tube that collects urine. What are the signs or symptoms? There are no symptoms of this condition. How is this diagnosed? This condition is diagnosed with a urine test. Because this condition does not cause symptoms, it is usually diagnosed when a urine sample is taken to treat or diagnose another condition, such as pregnancy or kidney problems. Most women who are in their first trimester of pregnancy are screened for asymptomatic bacteriuria. How is this treated? Usually, treatment is not needed for this condition. Treating the condition can lead to other problems, such as a yeast infection or the growth of bacteria that do not respond to treatment  (antibiotic-resistant bacteria). Some people do need treatment with antibiotic medicines to prevent kidney infection, known as pyelonephritis. Treatment is needed if:  You are pregnant. In pregnant women, kidney infection can lead to: ? Early labor (premature labor). ? Very low birth weight (fetal growth restriction). ? Newborn death.  You are having a procedure that affects the urinary tract.  You have had a kidney transplant. If you are diagnosed with this condition, talk with your health care provider about any concerns that you have. Follow these instructions at home: Medicines  Take over-the-counter and prescription medicines only as told by your health care provider.  If you were prescribed an antibiotic medicine, take it as told by your health care provider. Do not stop using the antibiotic even if you start to feel better. General instructions  Monitor your condition for any changes.  Drink enough fluid to keep your urine pale yellow.  Urinate more often to keep your bladder empty.  If you are female, keep the area around your vagina and rectum clean. Wipe from front to back after urinating or having a bowel movement. Use each piece of toilet paper only once.  Keep all follow-up visits. This is important. Contact a health care provider if:  You have symptoms of a urine infection, such as: ? A burning sensation, or pain when you urinate. ? A strong need to urinate, or urinating more often. ? Urine turning discolored or cloudy. ? Blood in your urine. ? Urine that smells bad. Get help right away if:  You develop signs of a kidney infection, such as: ? Back   pain or pelvic pain. ? A fever or chills. ? Nausea or vomiting. ? Severe pain that cannot be controlled with medicine. Summary  Asymptomatic bacteriuria is the presence of a large number of bacteria in the urine without the usual symptoms of burning or frequent urination.  Usually, treatment is not needed for  this condition. Treating the condition can lead to other problems, such as a yeast infection or the growth of bacteria that do not respond to treatment.  Some people do need treatment. Treatment is needed if you are pregnant, if you are having a procedure that affects the urinary tract, or if you have had a kidney transplant.  If you were prescribed an antibiotic medicine, take it as told by your health care provider. Do not stop using the antibiotic even if you start to feel better. This information is not intended to replace advice given to you by your health care provider. Make sure you discuss any questions you have with your health care provider. Document Revised: 01/06/2020 Document Reviewed: 01/06/2020 Elsevier Patient Education  2021 [employer] Inc.  

## 2020-10-22 NOTE — Assessment & Plan Note (Addendum)
Acute x 24 hours.  UA noting 3+ blood and 1+ leuks, 11-30 RBC.  Wet prep negative.  Will send in Macrobid to start due to symptoms and findings. Send urine for culture -- discussed with patient if any abnormal findings or need to change abx regimen will alert her.  Recommend increased hydration and Azo at home.  Return in 3 weeks to recheck urine.

## 2020-10-23 LAB — WET PREP FOR TRICH, YEAST, CLUE
Clue Cell Exam: NEGATIVE
Trichomonas Exam: NEGATIVE
Yeast Exam: NEGATIVE

## 2020-10-23 LAB — URINALYSIS, ROUTINE W REFLEX MICROSCOPIC
Bilirubin, UA: NEGATIVE
Glucose, UA: NEGATIVE
Ketones, UA: NEGATIVE
Nitrite, UA: NEGATIVE
Protein,UA: NEGATIVE
Specific Gravity, UA: 1.02 (ref 1.005–1.030)
Urobilinogen, Ur: 0.2 mg/dL (ref 0.2–1.0)
pH, UA: 5.5 (ref 5.0–7.5)

## 2020-10-23 LAB — MICROSCOPIC EXAMINATION: Bacteria, UA: NONE SEEN

## 2020-10-24 LAB — URINE CULTURE

## 2020-10-24 NOTE — Progress Notes (Signed)
Lvm to make this apt. 

## 2020-10-24 NOTE — Progress Notes (Signed)
Please let Kristen Larson know her urine culture returned showing no real growth.  She can stop antibiotic if not complete and if ongoing symptoms return to office.

## 2020-10-25 ENCOUNTER — Telehealth: Payer: Self-pay

## 2020-10-25 NOTE — Telephone Encounter (Signed)
Patient notified and verbalized understanding and has no further questions.  

## 2020-10-25 NOTE — Progress Notes (Signed)
Pt declined apt spoke with [facility]

## 2020-10-25 NOTE — Telephone Encounter (Signed)
Copied from CRM [id number]. Topic: General - Other >> Oct 24, 2020  4:59 PM Gaetana Michaelis A wrote: Reason for CRM: Patient would like to be contacted by a member of staff regarding their lab results  Patient was directed to make an additional appointment to discuss them further but declined to do so   Please contact to further advise when possible

## 2020-10-26 ENCOUNTER — Other Ambulatory Visit: Payer: Self-pay

## 2020-10-26 ENCOUNTER — Ambulatory Visit
Admission: RE | Admit: 2020-10-26 | Discharge: 2020-10-26 | Disposition: A | Discharge status: Home / Self Care | Payer: 59 | Source: Ambulatory Visit | Attending: Nurse Practitioner | Admitting: Nurse Practitioner

## 2020-10-26 DIAGNOSIS — R748 Abnormal levels of other serum enzymes: Secondary | ICD-10-CM | POA: Diagnosis not present

## 2020-11-23 ENCOUNTER — Ambulatory Visit: Payer: 59 | Admitting: Nurse Practitioner

## 2020-11-30 ENCOUNTER — Encounter: Payer: Self-pay | Admitting: Nurse Practitioner

## 2020-11-30 ENCOUNTER — Other Ambulatory Visit: Payer: Self-pay

## 2020-11-30 ENCOUNTER — Ambulatory Visit: Payer: 59 | Admitting: Nurse Practitioner

## 2020-11-30 VITALS — BP 118/70 | HR 61 | Temp 98.7°F | Wt 157.2 lb

## 2020-11-30 DIAGNOSIS — R3121 Asymptomatic microscopic hematuria: Secondary | ICD-10-CM | POA: Diagnosis not present

## 2020-11-30 DIAGNOSIS — M1A0710 Idiopathic chronic gout, right ankle and foot, without tophus (tophi): Secondary | ICD-10-CM | POA: Diagnosis not present

## 2020-11-30 LAB — MICROSCOPIC EXAMINATION
Bacteria, UA: NONE SEEN
WBC, UA: NONE SEEN /hpf (ref 0–5)

## 2020-11-30 LAB — URINALYSIS, ROUTINE W REFLEX MICROSCOPIC
Bilirubin, UA: NEGATIVE
Glucose, UA: NEGATIVE
Leukocytes,UA: NEGATIVE
Nitrite, UA: NEGATIVE
Protein,UA: NEGATIVE
Specific Gravity, UA: 1.025 (ref 1.005–1.030)
Urobilinogen, Ur: 0.2 mg/dL (ref 0.2–1.0)
pH, UA: 5 (ref 5.0–7.5)

## 2020-11-30 MED ORDER — ALLOPURINOL 100 MG PO TABS: 200.0000 mg | ORAL_TABLET | Freq: Every day | ORAL | 4 refills | Status: DC

## 2020-11-30 MED ORDER — COLCHICINE 0.6 MG PO TABS: ORAL_TABLET | ORAL | 0 refills | Status: DC

## 2020-11-30 NOTE — Assessment & Plan Note (Signed)
Acute episode at present for a few days to right foot.  Obtain labs today to include CBC, CMP, uric acid.  Send in Colchicine and increase Allopurinol to 200 MG daily.  Recommend monitoring diet and low purine diet focus at home.  Return in 2 weeks, may send back to podiatry for repeat injection if ongoing.

## 2020-11-30 NOTE — Assessment & Plan Note (Addendum)
Noted on last urine check at 3+, today is 1+ and 1+ ketones.  Will recheck next visit and recommend she increase hydration.  No further menstrual cycles.  May need kidney ultrasound if ongoing finding or CT to assess for kidney stones.

## 2020-11-30 NOTE — Patient Instructions (Signed)
Gout Gout is painful swelling of your joints. Gout is a type of arthritis. It is caused by having too much uric acid in your body. Uric acid is a chemical that is made when your body breaks down substances called purines. If your body has too much uric acid, sharp crystals can form and build up in your joints. This causes pain and swelling. Gout attacks can happen quickly and be very painful (acute gout). Over time, the attacks can affect more joints and happen more often (chronic gout). What are the causes? Too much uric acid in your blood. This can happen because: Your kidneys do not remove enough uric acid from your blood. Your body makes too much uric acid. You eat too many foods that are high in purines. These foods include organ meats, some seafood, and beer. Trauma or stress. What increases the risk? Having a family history of gout. Being female and middle-aged. Being female and having gone through menopause. Being very overweight (obese). Drinking alcohol, especially beer. Not having enough water in the body (being dehydrated). Losing weight too quickly. Having an organ transplant. Having lead poisoning. Taking certain medicines. Having kidney disease. Having a skin condition called psoriasis. What are the signs or symptoms? An attack of acute gout usually happens in just one joint. The most common place is the big toe. Attacks often start at night. Other joints that may be affected include joints of the feet, ankle, knee, fingers, wrist, or elbow. Symptoms of an attack may include: Very bad pain. Warmth. Swelling. Stiffness. Shiny, red, or purple skin. Tenderness. The affected joint may be very painful to touch. Chills and fever. Chronic gout may cause symptoms more often. More joints may be involved. You may also have white or yellow lumps (tophi) on your hands or feet or in other areas near your joints. How is this treated? Treatment for this condition has two phases:  treating an acute attack and preventing future attacks. Acute gout treatment may include: NSAIDs. Steroids. These are taken by mouth or injected into a joint. Colchicine. This medicine relieves pain and swelling. It can be given by mouth or through an IV tube. Preventive treatment may include: Taking small doses of NSAIDs or colchicine daily. Using a medicine that reduces uric acid levels in your blood. Making changes to your diet. You may need to see a food expert (dietitian) about what to eat and drink to prevent gout. Follow these instructions at home: During a gout attack  If told, put ice on the painful area: Put ice in a plastic bag. Place a towel between your skin and the bag. Leave the ice on for 20 minutes, 2-3 times a day. Raise (elevate) the painful joint above the level of your heart as often as you can. Rest the joint as much as possible. If the joint is in your leg, you may be given crutches. Follow instructions from your doctor about what you cannot eat or drink. Avoiding future gout attacks Eat a low-purine diet. Avoid foods and drinks such as: Liver. Kidney. Anchovies. Asparagus. Herring. Mushrooms. Mussels. Beer. Stay at a healthy weight. If you want to lose weight, talk with your doctor. Do not lose weight too fast. Start or continue an exercise plan as told by your doctor. Eating and drinking Drink enough fluids to keep your pee (urine) pale yellow. If you drink alcohol: Limit how much you use to: 0-1 drink a day for women. 0-2 drinks a day for men. Be aware of   how much alcohol is in your drink. In the [place] one drink equals one 12 oz bottle of beer (355 mL), one 5 oz glass of wine (148 mL), or one 1 oz glass of hard liquor (44 mL). General instructions Take over-the-counter and prescription medicines only as told by your doctor. Do not drive or use heavy machinery while taking prescription pain medicine. Return to your normal activities as told by your  doctor. Ask your doctor what activities are safe for you. Keep all follow-up visits as told by your doctor. This is important. Contact a doctor if: You have another gout attack. You still have symptoms of a gout attack after 10 days of treatment. You have problems (side effects) because of your medicines. You have chills or a fever. You have burning pain when you pee (urinate). You have pain in your lower back or belly. Get help right away if: You have very bad pain. Your pain cannot be controlled. You cannot pee. Summary Gout is painful swelling of the joints. The most common site of pain is the big toe, but it can affect other joints. Medicines and avoiding some foods can help to prevent and treat gout attacks. This information is not intended to replace advice given to you by your health care provider. Make sure you discuss any questions you have with your health care provider. Document Revised: 12/16/2017 Document Reviewed: 12/16/2017 Elsevier Patient Education  2022 [employer]  

## 2020-11-30 NOTE — Progress Notes (Signed)
BP 118/70 (BP Location: Left Arm)   Pulse 61   Temp 98.7 F (37.1 C) (Oral)   Wt 157 lb 3.2 oz (71.3 kg)   LMP  (LMP Unknown)   SpO2 97%   BMI 28.93 kg/m    Subjective:    Patient ID: Kristen Larson, female    DOB: 1960-07-29, 60 y.o.   MRN: [id number]  HPI: Kristen Larson is a 60 y.o. female  Chief Complaint  Patient presents with   Urinary Tract Infection    Patient states her symptoms have gotten better since her last visit.   Hematuria   Gout    Patient states Monday is when she noticed issues with her Gout. Patient would like to discuss possible medication to prevent flares up, as they have gotten worse. Patient states it gets worse throughout the day as she walks on it more.    HEMATURIA Treated with Macrobid on 10/22/20 -- urine at time noted 3+ blood, but final culture was negative, Macrobid stopped.  No further symptoms.  No longer has menstrual cycles. Dysuria: no Urinary frequency: no Urgency: no Small volume voids: no Symptom severity: no Urinary incontinence: no Foul odor: no Hematuria: no Abdominal pain: no Back pain: no Suprapubic pain/pressure: no Flank pain: no Fever:  yes, no, subjective, and low grade Vomiting: no Status: better Previous urinary tract infection: yes Recurrent urinary tract infection: no Sexual activity: monogamous History of sexually transmitted disease: no Treatments attempted: abx    GOUT She was started on Allopurinol 100 MG daily and currently reports gout flare to right foot.  Started on Monday, around ankle and pain in foot.  Last uric acid level 5.5 with recent flare on 10/02/20.   Duration:days Right 1st metatarsophalangeal pain: yes Left 1st metatarsophalangeal pain: no Right knee pain: no Left knee pain: no Severity: 5/10  Quality: dull, aching, and throbbing Swelling: yes Redness: yes Trauma: no Recent dietary change or indiscretion: no Fevers: no Nausea/vomiting: no Aggravating factors:  unknown Alleviating factors: unknown Status:  stable Treatments attempted: Allopurinol  Relevant past medical, surgical, family and social history reviewed and updated as indicated. Interim medical history since our last visit reviewed. Allergies and medications reviewed and updated.  Review of Systems  Constitutional:  Negative for activity change, appetite change, diaphoresis, fatigue and fever.  Respiratory:  Negative for cough, chest tightness and shortness of breath.   Cardiovascular:  Negative for chest pain, palpitations and leg swelling.  Gastrointestinal: Negative.   Neurological: Negative.   Psychiatric/Behavioral: Negative.     Per HPI unless specifically indicated above     Objective:    BP 118/70 (BP Location: Left Arm)   Pulse 61   Temp 98.7 F (37.1 C) (Oral)   Wt 157 lb 3.2 oz (71.3 kg)   LMP  (LMP Unknown)   SpO2 97%   BMI 28.93 kg/m   Wt Readings from Last 3 Encounters:  11/30/20 157 lb 3.2 oz (71.3 kg)  10/22/20 158 lb 3.2 oz (71.8 kg)  10/02/20 162 lb 3.2 oz (73.6 kg)    Physical Exam Vitals and nursing note reviewed.  Constitutional:      General: She is awake. She is not in acute distress.    Appearance: She is well-developed and overweight. She is not ill-appearing.  HENT:     Head: Normocephalic.     Right Ear: Hearing normal.     Left Ear: Hearing normal.  Eyes:     General: Lids are normal.  Right eye: No discharge.        Left eye: No discharge.     Conjunctiva/sclera: Conjunctivae normal.     Pupils: Pupils are equal, round, and reactive to light.  Neck:     Vascular: No carotid bruit.  Cardiovascular:     Rate and Rhythm: Normal rate and regular rhythm.     Pulses:          Dorsalis pedis pulses are 2+ on the right side and 2+ on the left side.       Posterior tibial pulses are 2+ on the right side and 2+ on the left side.     Heart sounds: Normal heart sounds. No murmur heard.   No gallop.  Pulmonary:     Effort:  Pulmonary effort is normal. No accessory muscle usage or respiratory distress.     Breath sounds: Normal breath sounds.  Abdominal:     General: Bowel sounds are normal.     Palpations: Abdomen is soft.  Musculoskeletal:     Cervical back: Normal range of motion and neck supple.     Right lower leg: No edema.     Left lower leg: No edema.     Right foot: Normal range of motion.     Left foot: Normal range of motion.       Feet:  Feet:     Right foot:     Skin integrity: Erythema (to lateral aspect) and warmth present.     Toenail Condition: Right toenails are normal.     Left foot:     Skin integrity: Skin integrity normal.     Toenail Condition: Left toenails are normal.  Skin:    General: Skin is warm and dry.  Neurological:     Mental Status: She is alert and oriented to person, place, and time.  Psychiatric:        Attention and Perception: Attention normal.        Mood and Affect: Mood normal.        Speech: Speech normal.        Behavior: Behavior normal. Behavior is cooperative.    Results for orders placed or performed in visit on 10/22/20  WET PREP FOR TRICH, YEAST, CLUE   Specimen: Sterile Swab   Sterile Swab  Result Value Ref Range   Trichomonas Exam Negative Negative   Yeast Exam Negative Negative   Clue Cell Exam Negative Negative  Urine Culture   Specimen: Urine   UR  Result Value Ref Range   Urine Culture, Routine Final report    Organism ID, Bacteria Comment   Microscopic Examination   Urine  Result Value Ref Range   WBC, UA 0-5 0 - 5 /hpf   RBC 11-30 (A) 0 - 2 /hpf   Epithelial Cells (non renal) 0-10 0 - 10 /hpf   Mucus, UA Present (A) Not Estab.   Bacteria, UA None seen None seen/Few  Urinalysis, Routine w reflex microscopic  Result Value Ref Range   Specific Gravity, UA 1.020 1.005 - 1.030   pH, UA 5.5 5.0 - 7.5   Color, UA Yellow Yellow   Appearance Ur Cloudy (A) Clear   Leukocytes,UA 1+ (A) Negative   Protein,UA Negative Negative/Trace    Glucose, UA Negative Negative   Ketones, UA Negative Negative   RBC, UA 3+ (A) Negative   Bilirubin, UA Negative Negative   Urobilinogen, Ur 0.2 0.2 - 1.0 mg/dL   Nitrite, UA Negative Negative  Microscopic Examination See below:       Assessment & Plan:   Problem List Items Addressed This Visit       Genitourinary   Asymptomatic microscopic hematuria    Noted on last urine check at 3+, today is 1+ and 1+ ketones.  Will recheck next visit and recommend she increase hydration.  No further menstrual cycles.  May need kidney ultrasound if ongoing finding or CT to assess for kidney stones.       Relevant Orders   Urinalysis, Routine w reflex microscopic     Other   Chronic gout - Primary    Acute episode at present for a few days to right foot.  Obtain labs today to include CBC, CMP, uric acid.  Send in Colchicine and increase Allopurinol to 200 MG daily.  Recommend monitoring diet and low purine diet focus at home.  Return in 2 weeks, may send back to podiatry for repeat injection if ongoing.       Relevant Orders   CBC with Differential/Platelet   Comprehensive metabolic panel   Uric acid     Follow up plan: Return in about 2 weeks (around 12/14/2020) for Gout.

## 2020-12-01 LAB — COMPREHENSIVE METABOLIC PANEL
ALT: 19 IU/L (ref 0–32)
AST: 22 IU/L (ref 0–40)
Albumin/Globulin Ratio: 2 (ref 1.2–2.2)
Albumin: 4.3 g/dL (ref 3.8–4.9)
Alkaline Phosphatase: 138 IU/L — ABNORMAL HIGH (ref 44–121)
BUN/Creatinine Ratio: 14 (ref 9–23)
BUN: 13 mg/dL (ref 6–24)
Bilirubin Total: 0.8 mg/dL (ref 0.0–1.2)
CO2: 23 mmol/L (ref 20–29)
Calcium: 9.5 mg/dL (ref 8.7–10.2)
Chloride: 102 mmol/L (ref 96–106)
Creatinine, Ser: 0.94 mg/dL (ref 0.57–1.00)
Globulin, Total: 2.2 g/dL (ref 1.5–4.5)
Glucose: 72 mg/dL (ref 65–99)
Potassium: 4.2 mmol/L (ref 3.5–5.2)
Sodium: 141 mmol/L (ref 134–144)
Total Protein: 6.5 g/dL (ref 6.0–8.5)
eGFR: 70 mL/min/{1.73_m2} (ref >59)

## 2020-12-01 LAB — CBC WITH DIFFERENTIAL/PLATELET
Basophils Absolute: 0 10*3/uL (ref 0.0–0.2)
Basos: 1 %
EOS (ABSOLUTE): 0.2 10*3/uL (ref 0.0–0.4)
Eos: 3 %
Hematocrit: 45 % (ref 34.0–46.6)
Hemoglobin: 14.7 g/dL (ref 11.1–15.9)
Immature Grans (Abs): 0 10*3/uL (ref 0.0–0.1)
Immature Granulocytes: 1 %
Lymphocytes Absolute: 1.1 10*3/uL (ref 0.7–3.1)
Lymphs: 21 %
MCH: 28.6 pg (ref 26.6–33.0)
MCHC: 32.7 g/dL (ref 31.5–35.7)
MCV: 88 fL (ref 79–97)
Monocytes Absolute: 0.4 10*3/uL (ref 0.1–0.9)
Monocytes: 7 %
Neutrophils Absolute: 3.8 10*3/uL (ref 1.4–7.0)
Neutrophils: 67 %
Platelets: 238 10*3/uL (ref 150–450)
RBC: 5.14 x10E6/uL (ref 3.77–5.28)
RDW: 12.9 % (ref 11.7–15.4)
WBC: 5.5 10*3/uL (ref 3.4–10.8)

## 2020-12-01 LAB — URIC ACID: Uric Acid: 5.4 mg/dL (ref 3.0–7.2)

## 2020-12-01 NOTE — Progress Notes (Signed)
Good morning, please let Kristen Larson know labs have returned and overall all is normal, including uric acid level.  I would continue acute treatment as ordered and the increase in Allopurinol, if ongoing pain then recommend return to podiatry as injections may be required again.  Any questions? Keep being awesome!!  Thank you for allowing me to participate in your care.  I appreciate you. Kindest regards, Rigby Swamy

## 2020-12-03 ENCOUNTER — Ambulatory Visit: Payer: 59 | Admitting: Nurse Practitioner

## 2020-12-03 ENCOUNTER — Ambulatory Visit: Payer: 59 | Admitting: Podiatry

## 2020-12-03 ENCOUNTER — Encounter: Payer: Self-pay | Admitting: Nurse Practitioner

## 2020-12-10 ENCOUNTER — Other Ambulatory Visit: Payer: Self-pay | Admitting: Nurse Practitioner

## 2020-12-14 ENCOUNTER — Other Ambulatory Visit: Payer: Self-pay

## 2020-12-14 ENCOUNTER — Encounter: Payer: Self-pay | Admitting: Nurse Practitioner

## 2020-12-14 ENCOUNTER — Ambulatory Visit: Payer: 59 | Admitting: Nurse Practitioner

## 2020-12-14 DIAGNOSIS — M1A0710 Idiopathic chronic gout, right ankle and foot, without tophus (tophi): Secondary | ICD-10-CM | POA: Diagnosis not present

## 2020-12-14 NOTE — Patient Instructions (Signed)
Gout Gout is painful swelling of your joints. Gout is a type of arthritis. It is caused by having too much uric acid in your body. Uric acid is a chemical that is made when your body breaks down substances called purines. If your body has too much uric acid, sharp crystals can form and build up in your joints. This causes pain and swelling. Gout attacks can happen quickly and be very painful (acute gout). Over time, the attacks can affect more joints and happen more often (chronic gout). What are the causes? Too much uric acid in your blood. This can happen because: Your kidneys do not remove enough uric acid from your blood. Your body makes too much uric acid. You eat too many foods that are high in purines. These foods include organ meats, some seafood, and beer. Trauma or stress. What increases the risk? Having a family history of gout. Being female and middle-aged. Being female and having gone through menopause. Being very overweight (obese). Drinking alcohol, especially beer. Not having enough water in the body (being dehydrated). Losing weight too quickly. Having an organ transplant. Having lead poisoning. Taking certain medicines. Having kidney disease. Having a skin condition called psoriasis. What are the signs or symptoms? An attack of acute gout usually happens in just one joint. The most common place is the big toe. Attacks often start at night. Other joints that may be affected include joints of the feet, ankle, knee, fingers, wrist, or elbow. Symptoms of an attack may include: Very bad pain. Warmth. Swelling. Stiffness. Shiny, red, or purple skin. Tenderness. The affected joint may be very painful to touch. Chills and fever. Chronic gout may cause symptoms more often. More joints may be involved. You may also have white or yellow lumps (tophi) on your hands or feet or in other areas near your joints. How is this treated? Treatment for this condition has two phases:  treating an acute attack and preventing future attacks. Acute gout treatment may include: NSAIDs. Steroids. These are taken by mouth or injected into a joint. Colchicine. This medicine relieves pain and swelling. It can be given by mouth or through an IV tube. Preventive treatment may include: Taking small doses of NSAIDs or colchicine daily. Using a medicine that reduces uric acid levels in your blood. Making changes to your diet. You may need to see a food expert (dietitian) about what to eat and drink to prevent gout. Follow these instructions at home: During a gout attack  If told, put ice on the painful area: Put ice in a plastic bag. Place a towel between your skin and the bag. Leave the ice on for 20 minutes, 2-3 times a day. Raise (elevate) the painful joint above the level of your heart as often as you can. Rest the joint as much as possible. If the joint is in your leg, you may be given crutches. Follow instructions from your doctor about what you cannot eat or drink. Avoiding future gout attacks Eat a low-purine diet. Avoid foods and drinks such as: Liver. Kidney. Anchovies. Asparagus. Herring. Mushrooms. Mussels. Beer. Stay at a healthy weight. If you want to lose weight, talk with your doctor. Do not lose weight too fast. Start or continue an exercise plan as told by your doctor. Eating and drinking Drink enough fluids to keep your pee (urine) pale yellow. If you drink alcohol: Limit how much you use to: 0-1 drink a day for women. 0-2 drinks a day for men. Be aware of   how much alcohol is in your drink. In the [place] one drink equals one 12 oz bottle of beer (355 mL), one 5 oz glass of wine (148 mL), or one 1 oz glass of hard liquor (44 mL). General instructions Take over-the-counter and prescription medicines only as told by your doctor. Do not drive or use heavy machinery while taking prescription pain medicine. Return to your normal activities as told by your  doctor. Ask your doctor what activities are safe for you. Keep all follow-up visits as told by your doctor. This is important. Contact a doctor if: You have another gout attack. You still have symptoms of a gout attack after 10 days of treatment. You have problems (side effects) because of your medicines. You have chills or a fever. You have burning pain when you pee (urinate). You have pain in your lower back or belly. Get help right away if: You have very bad pain. Your pain cannot be controlled. You cannot pee. Summary Gout is painful swelling of the joints. The most common site of pain is the big toe, but it can affect other joints. Medicines and avoiding some foods can help to prevent and treat gout attacks. This information is not intended to replace advice given to you by your health care provider. Make sure you discuss any questions you have with your health care provider. Document Revised: 12/16/2017 Document Reviewed: 12/16/2017 Elsevier Patient Education  2022 [employer]  

## 2020-12-14 NOTE — Assessment & Plan Note (Signed)
Acute episode now resolved.  Will continue Colchicine as needed only for flares and daily Allopurinol 200 MG daily for prevention, may increase to 300 MG.  Recommend monitoring diet and low purine diet focus at home.  Return in October or November for regular follow-up.

## 2020-12-14 NOTE — Progress Notes (Signed)
BP 126/72   Pulse 72   Temp 98.4 F (36.9 C) (Oral)   Wt 156 lb 9.6 oz (71 kg)   LMP  (LMP Unknown)   SpO2 96%   BMI 28.82 kg/m    Subjective:    Patient ID: Kristen Larson, female    DOB: 04/15/1961, 60 y.o.   MRN: [id number]  HPI: Kristen Larson is a 60 y.o. female  Chief Complaint  Patient presents with   Gout    Patient states she is feeling a little tenderness and states it has not been swollen anymore. Patient states the medication is helping.    GOUT Allopurinol increased last visit to 200 MG daily and colchicine provided for acute episode, this has helped tremendously.  Started 2 weeks ago and now improving, around ankle and pain in foot.    Last uric acid level 5.4 with recent flare on 10/02/20.   Duration:days Right 1st metatarsophalangeal pain: none Left 1st metatarsophalangeal pain: no Right knee pain: no Left knee pain: no Severity: 1/10 occasionally Quality: dull ache only occasionally Swelling: yes Redness: none Trauma: no Recent dietary change or indiscretion: occasional Fevers: no Nausea/vomiting: no Aggravating factors: unknown Alleviating factors: unknown Status:  stable Treatments attempted: Allopurinol and Colchicine  Relevant past medical, surgical, family and social history reviewed and updated as indicated. Interim medical history since our last visit reviewed. Allergies and medications reviewed and updated.  Review of Systems  Constitutional:  Negative for activity change, appetite change, diaphoresis, fatigue and fever.  Respiratory:  Negative for cough, chest tightness and shortness of breath.   Cardiovascular:  Negative for chest pain, palpitations and leg swelling.  Gastrointestinal: Negative.   Neurological: Negative.   Psychiatric/Behavioral: Negative.     Per HPI unless specifically indicated above     Objective:    BP 126/72   Pulse 72   Temp 98.4 F (36.9 C) (Oral)   Wt 156 lb 9.6 oz (71 kg)   LMP  (LMP Unknown)    SpO2 96%   BMI 28.82 kg/m   Wt Readings from Last 3 Encounters:  12/14/20 156 lb 9.6 oz (71 kg)  11/30/20 157 lb 3.2 oz (71.3 kg)  10/22/20 158 lb 3.2 oz (71.8 kg)    Physical Exam Vitals and nursing note reviewed.  Constitutional:      General: She is awake. She is not in acute distress.    Appearance: She is well-developed and overweight. She is not ill-appearing.  HENT:     Head: Normocephalic.     Right Ear: Hearing normal.     Left Ear: Hearing normal.  Eyes:     General: Lids are normal.        Right eye: No discharge.        Left eye: No discharge.     Conjunctiva/sclera: Conjunctivae normal.     Pupils: Pupils are equal, round, and reactive to light.  Neck:     Vascular: No carotid bruit.  Cardiovascular:     Rate and Rhythm: Normal rate and regular rhythm.     Pulses:          Dorsalis pedis pulses are 2+ on the right side and 2+ on the left side.       Posterior tibial pulses are 2+ on the right side and 2+ on the left side.     Heart sounds: Normal heart sounds. No murmur heard.   No gallop.  Pulmonary:     Effort: Pulmonary effort  is normal. No accessory muscle usage or respiratory distress.     Breath sounds: Normal breath sounds.  Abdominal:     General: Bowel sounds are normal.     Palpations: Abdomen is soft.  Musculoskeletal:     Cervical back: Normal range of motion and neck supple.     Right lower leg: No edema.     Left lower leg: No edema.     Right foot: Normal range of motion.     Left foot: Normal range of motion.  Feet:     Right foot:     Skin integrity: No erythema or warmth.     Toenail Condition: Right toenails are normal.     Left foot:     Skin integrity: Skin integrity normal.     Toenail Condition: Left toenails are normal.  Skin:    General: Skin is warm and dry.  Neurological:     Mental Status: She is alert and oriented to person, place, and time.  Psychiatric:        Attention and Perception: Attention normal.        Mood  and Affect: Mood normal.        Speech: Speech normal.        Behavior: Behavior normal. Behavior is cooperative.    Results for orders placed or performed in visit on 11/30/20  Microscopic Examination   Urine  Result Value Ref Range   WBC, UA None seen 0 - 5 /hpf   RBC 0-2 0 - 2 /hpf   Epithelial Cells (non renal) 0-10 0 - 10 /hpf   Mucus, UA Present (A) Not Estab.   Bacteria, UA None seen None seen/Few  Urinalysis, Routine w reflex microscopic  Result Value Ref Range   Specific Gravity, UA 1.025 1.005 - 1.030   pH, UA 5.0 5.0 - 7.5   Color, UA Yellow Yellow   Appearance Ur Clear Clear   Leukocytes,UA Negative Negative   Protein,UA Negative Negative/Trace   Glucose, UA Negative Negative   Ketones, UA 1+ (A) Negative   RBC, UA 1+ (A) Negative   Bilirubin, UA Negative Negative   Urobilinogen, Ur 0.2 0.2 - 1.0 mg/dL   Nitrite, UA Negative Negative   Microscopic Examination See below:   CBC with Differential/Platelet  Result Value Ref Range   WBC 5.5 3.4 - 10.8 x10E3/uL   RBC 5.14 3.77 - 5.28 x10E6/uL   Hemoglobin 14.7 11.1 - 15.9 g/dL   Hematocrit 45.0 34.0 - 46.6 %   MCV 88 79 - 97 fL   MCH 28.6 26.6 - 33.0 pg   MCHC 32.7 31.5 - 35.7 g/dL   RDW 12.9 11.7 - 15.4 %   Platelets 238 150 - 450 x10E3/uL   Neutrophils 67 Not Estab. %   Lymphs 21 Not Estab. %   Monocytes 7 Not Estab. %   Eos 3 Not Estab. %   Basos 1 Not Estab. %   Neutrophils Absolute 3.8 1.4 - 7.0 x10E3/uL   Lymphocytes Absolute 1.1 0.7 - 3.1 x10E3/uL   Monocytes Absolute 0.4 0.1 - 0.9 x10E3/uL   EOS (ABSOLUTE) 0.2 0.0 - 0.4 x10E3/uL   Basophils Absolute 0.0 0.0 - 0.2 x10E3/uL   Immature Granulocytes 1 Not Estab. %   Immature Grans (Abs) 0.0 0.0 - 0.1 x10E3/uL  Comprehensive metabolic panel  Result Value Ref Range   Glucose 72 65 - 99 mg/dL   BUN 13 6 - 24 mg/dL   Creatinine, Ser 0.94 0.57 -  1.00 mg/dL   eGFR 70 >59 mL/min/1.73   BUN/Creatinine Ratio 14 9 - 23   Sodium 141 134 - 144 mmol/L    Potassium 4.2 3.5 - 5.2 mmol/L   Chloride 102 96 - 106 mmol/L   CO2 23 20 - 29 mmol/L   Calcium 9.5 8.7 - 10.2 mg/dL   Total Protein 6.5 6.0 - 8.5 g/dL   Albumin 4.3 3.8 - 4.9 g/dL   Globulin, Total 2.2 1.5 - 4.5 g/dL   Albumin/Globulin Ratio 2.0 1.2 - 2.2   Bilirubin Total 0.8 0.0 - 1.2 mg/dL   Alkaline Phosphatase 138 (H) 44 - 121 IU/L   AST 22 0 - 40 IU/L   ALT 19 0 - 32 IU/L  Uric acid  Result Value Ref Range   Uric Acid 5.4 3.0 - 7.2 mg/dL      Assessment & Plan:   Problem List Items Addressed This Visit       Other   Chronic gout    Acute episode now resolved.  Will continue Colchicine as needed only for flares and daily Allopurinol 200 MG daily for prevention, may increase to 300 MG.  Recommend monitoring diet and low purine diet focus at home.  Return in October or November for regular follow-up.         Follow up plan: Return for as scheduled.

## 2020-12-25 ENCOUNTER — Telehealth: Payer: Self-pay | Admitting: Nurse Practitioner

## 2020-12-25 ENCOUNTER — Telehealth: Payer: Self-pay

## 2020-12-25 DIAGNOSIS — M1A0710 Idiopathic chronic gout, right ankle and foot, without tophus (tophi): Secondary | ICD-10-CM

## 2020-12-25 NOTE — Telephone Encounter (Signed)
Copied from CRM [id number]. Topic: Referral - Request for Referral >> Dec 25, 2020  3:45 PM Jaquita Rector A wrote: Has patient seen PCP for this complaint? Yes *If NO, is insurance requiring patient see PCP for this issue before PCP can refer them? Referral for which specialty: Rhumatologist  Preferred provider/office: [facility]  Reason for referral: Gout

## 2020-12-25 NOTE — Telephone Encounter (Signed)
Copied from CRM #[id number]. Topic: Referral - Request for Referral >> Dec 25, 2020  3:45 PM Davis, Karen A wrote: Has patient seen PCP for this complaint? Yes *If NO, is insurance requiring patient see PCP for this issue before PCP can refer them? Referral for which specialty: Rhumatologist  Preferred provider/office: [facility]  Reason for referral: Gout 

## 2020-12-31 IMAGING — MG DIGITAL SCREENING BILAT W/ TOMO W/ CAD
8 series · 8 of 24 positions shown · non-contrast
Comparison: Previous exam(s).

CLINICAL DATA: Screening.

EXAM:
DIGITAL SCREENING BILATERAL MAMMOGRAM WITH TOMO AND CAD

[R CC synth-2D]
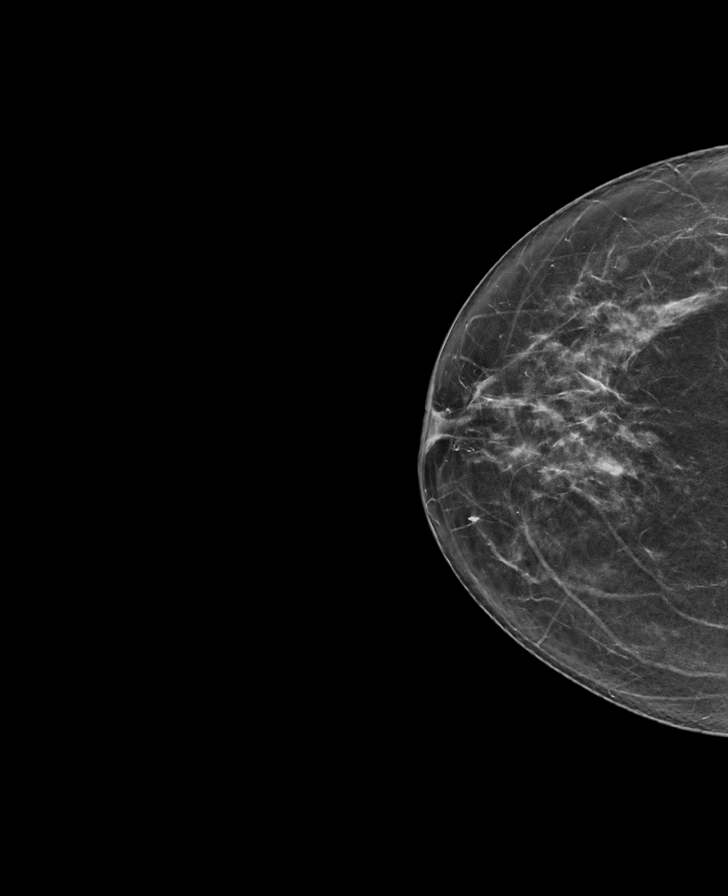

[L CC synth-2D]
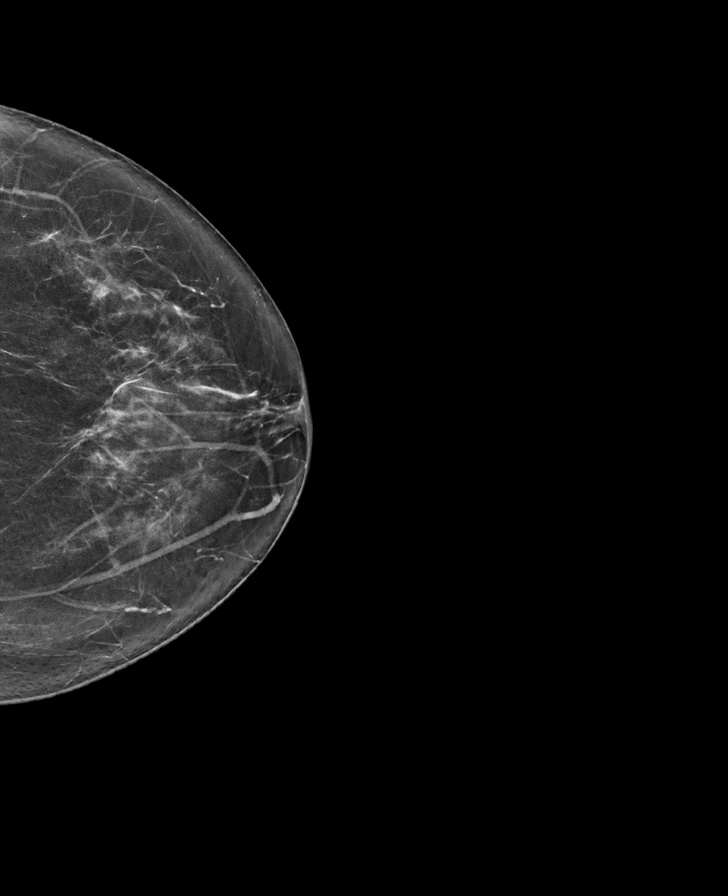

[L MLO synth-2D]
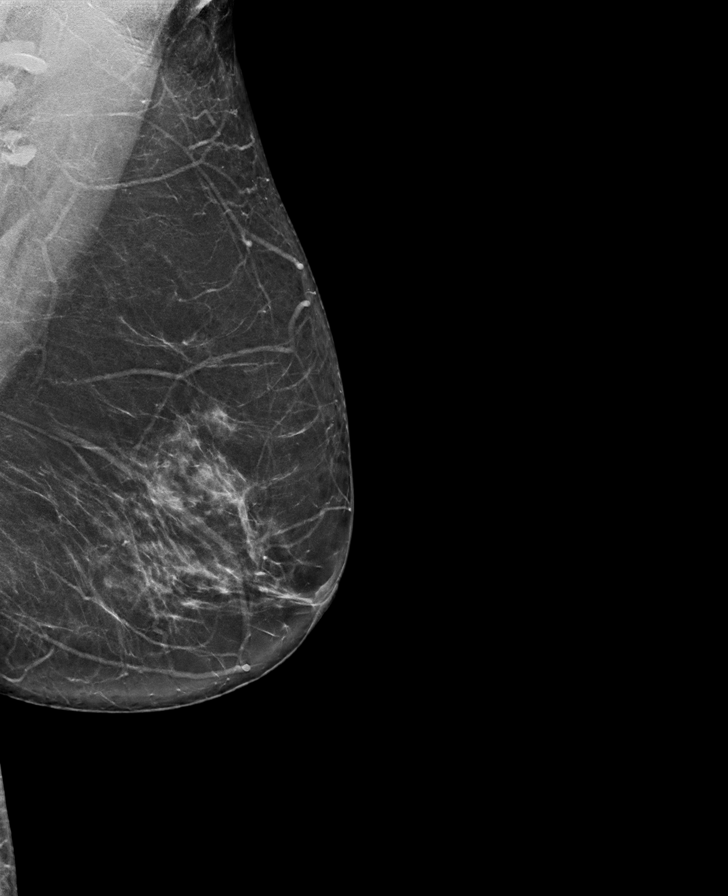

[R MLO synth-2D]
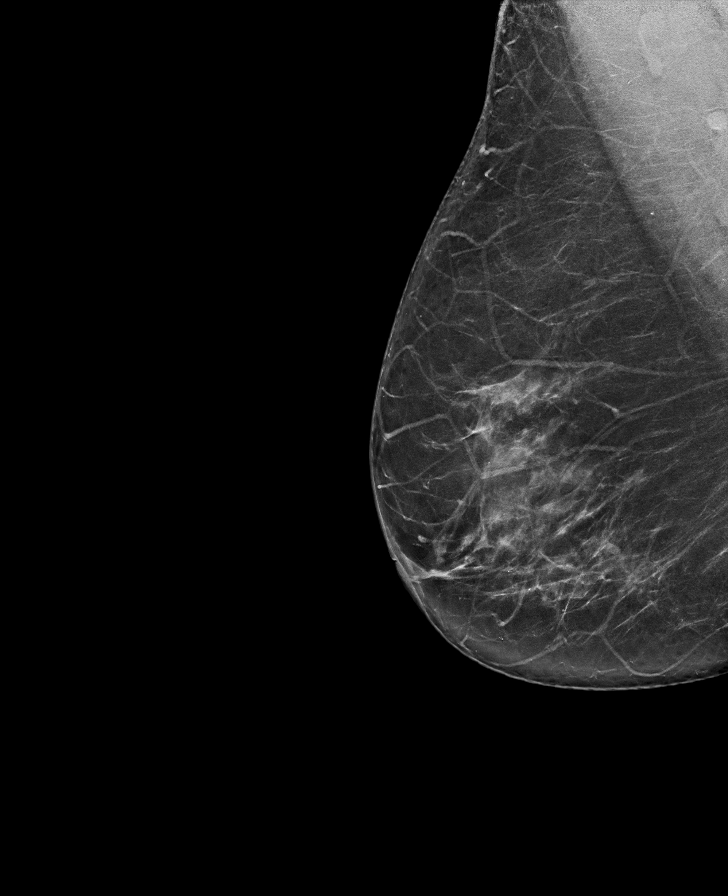

[R CC tomo · tomo slice 37/72.0]
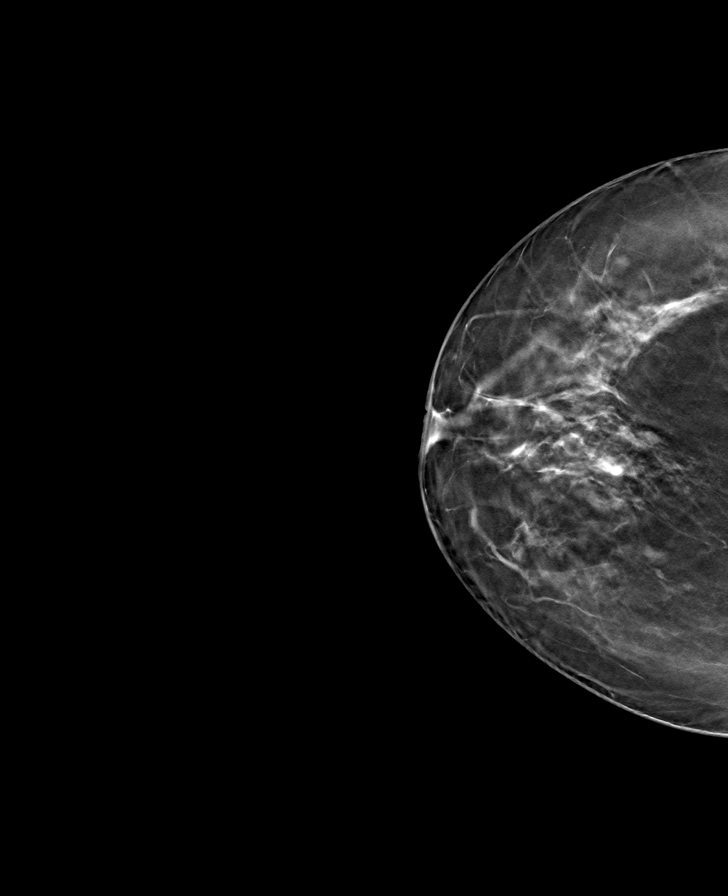

[L CC tomo · tomo slice 36/71.0]
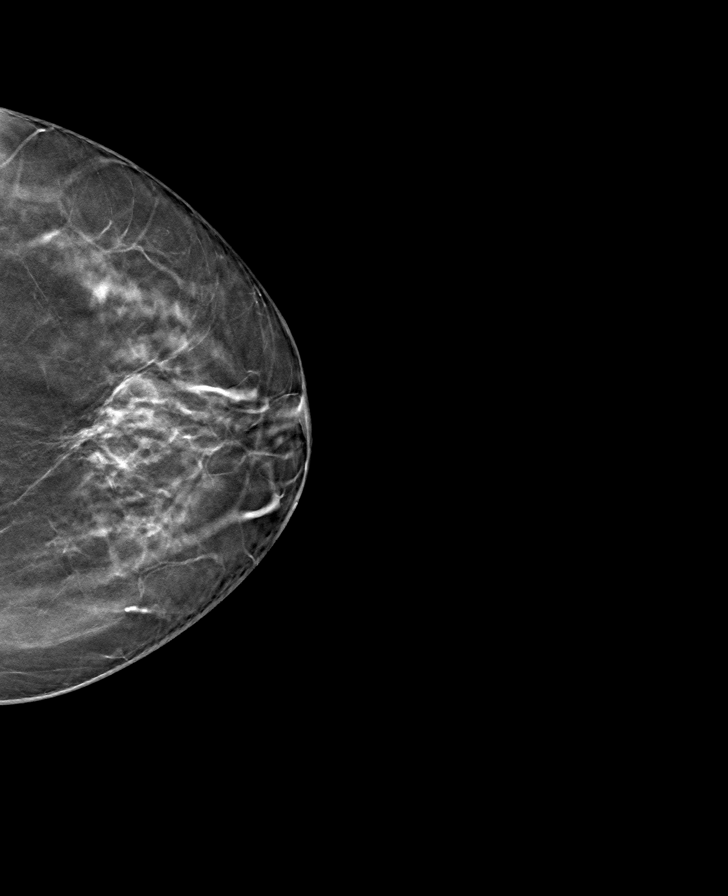

[R MLO tomo · tomo slice 41/80.0]
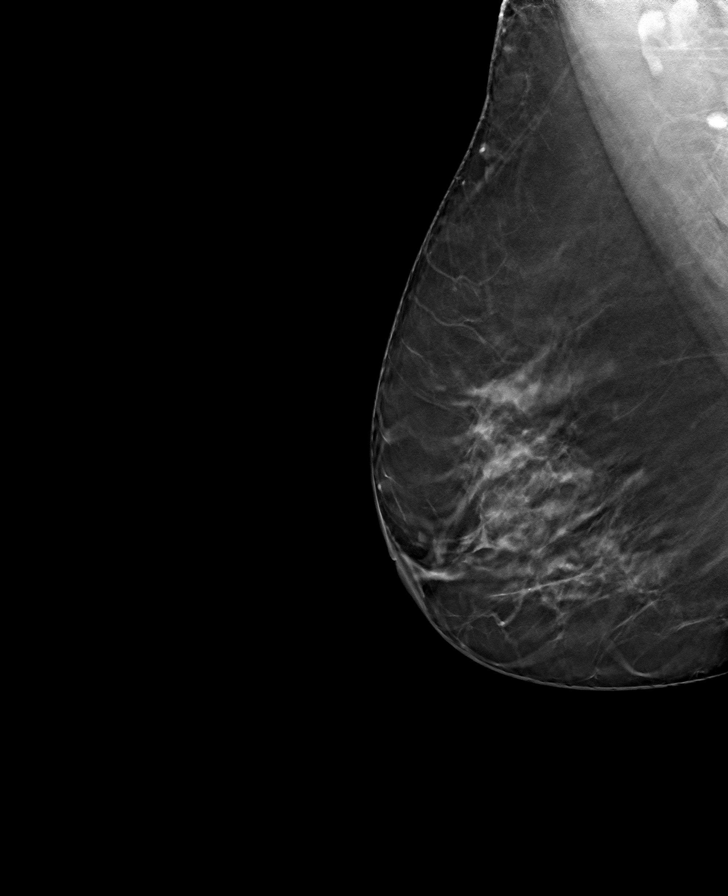

[L MLO tomo · tomo slice 39/76.0]
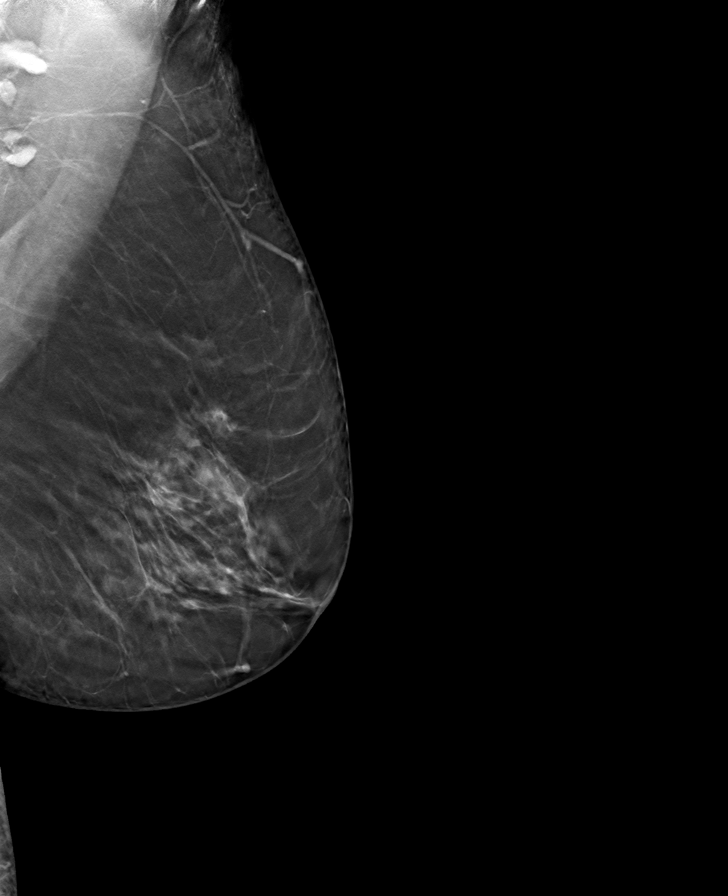

[8 of 24 positions shown; findings below may reference images not displayed]

ACR Breast Density Category c: The breast tissue is heterogeneously
dense, which may obscure small masses.
FINDINGS: There are no findings suspicious for malignancy. Images were
processed with CAD.
IMPRESSION: No mammographic evidence of malignancy. A result letter of this
screening mammogram will be mailed directly to the patient.

RECOMMENDATION:
Screening mammogram in one year. (Code:FT-U-LHB)

BI-RADS CATEGORY  1: Negative.

## 2021-01-07 ENCOUNTER — Other Ambulatory Visit: Payer: Self-pay | Admitting: Nurse Practitioner

## 2021-01-07 DIAGNOSIS — Z1231 Encounter for screening mammogram for malignant neoplasm of breast: Secondary | ICD-10-CM

## 2021-01-11 ENCOUNTER — Ambulatory Visit: Payer: 59 | Admitting: Nurse Practitioner

## 2021-01-15 ENCOUNTER — Other Ambulatory Visit: Payer: Self-pay | Admitting: Nurse Practitioner

## 2021-01-15 MED ORDER — COLCHICINE 0.6 MG PO TABS: ORAL_TABLET | ORAL | 0 refills | Status: DC

## 2021-01-15 NOTE — Telephone Encounter (Signed)
Copied from CRM [id number]. Topic: Quick Communication - Rx Refill/Question >> Jan 15, 2021  9:02 AM Gaetana Michaelis A wrote: Medication: colchicine 0.6 MG tablet   Has the patient contacted their pharmacy? Yes.   (Agent: If no, request that the patient contact the pharmacy for the refill.) (Agent: If yes, when and what did the pharmacy advise?)  Preferred Pharmacy (with phone number or street name): [facility] - [place], [place] - [address]  Phone:  [phone] Fax:  [phone]   Agent: Please be advised that RX refills may take up to 3 business days. We ask that you follow-up with your pharmacy.

## 2021-02-08 ENCOUNTER — Other Ambulatory Visit: Payer: Self-pay | Admitting: Nurse Practitioner

## 2021-02-08 NOTE — Telephone Encounter (Signed)
Requested medication (s) are due for refill today - no  Requested medication (s) are on the active medication list -yes  Future visit scheduled -yes  Last refill: 01/15/21 #30 2RF  Notes to clinic: Request RF: Request too soon, patient referred to rheumatology for diagnosis follow up  Requested Prescriptions  Pending Prescriptions Disp Refills   colchicine 0.6 MG tablet [Pharmacy Med Name: COLCHICINE 0.6 MG TABLET] 30 tablet 2    Sig: Take 1.2 MG (two tablets) by mouth today and then tomorrow start 0.6 MG (one tablet) by mouth daily until gout flare improved. If no improvement after one week return to office.     Endocrinology:  Gout Agents Passed - 02/08/2021  9:34 AM      Passed - Uric Acid in normal range and within 360 days    Uric Acid  Date Value Ref Range Status  11/30/2020 5.4 3.0 - 7.2 mg/dL Final    Comment:               Therapeutic target for gout patients: <6.0          Passed - Cr in normal range and within 360 days    Creatinine  Date Value Ref Range Status  05/25/2014 1.03 0.60 - 1.30 mg/dL Final   Creatinine, Ser  Date Value Ref Range Status  11/30/2020 0.94 0.57 - 1.00 mg/dL Final          Passed - Valid encounter within last 12 months    Recent Outpatient Visits           1 month ago Idiopathic chronic gout of right foot without tophus   [facility] [place], Jolene T, NP   2 months ago Idiopathic chronic gout of right foot without tophus   [facility] [place], Valinda T, NP   3 months ago Urinary symptom or sign   [facility], [place] T, NP   4 months ago Right foot pain   [facility] [place], Lauren A, NP   4 months ago Acute idiopathic gout involving toe of right foot   [facility] [place], Lauren A, NP       Future Appointments             In 1 month Cannady, Dorie Rank, NP [facility]               Requested Prescriptions  Pending Prescriptions  Disp Refills   colchicine 0.6 MG tablet [Pharmacy Med Name: COLCHICINE 0.6 MG TABLET] 30 tablet 2    Sig: Take 1.2 MG (two tablets) by mouth today and then tomorrow start 0.6 MG (one tablet) by mouth daily until gout flare improved. If no improvement after one week return to office.     Endocrinology:  Gout Agents Passed - 02/08/2021  9:34 AM      Passed - Uric Acid in normal range and within 360 days    Uric Acid  Date Value Ref Range Status  11/30/2020 5.4 3.0 - 7.2 mg/dL Final    Comment:               Therapeutic target for gout patients: <6.0          Passed - Cr in normal range and within 360 days    Creatinine  Date Value Ref Range Status  05/25/2014 1.03 0.60 - 1.30 mg/dL Final   Creatinine, Ser  Date Value Ref Range Status  11/30/2020 0.94 0.57 - 1.00  mg/dL Final          Passed - Valid encounter within last 12 months    Recent Outpatient Visits           1 month ago Idiopathic chronic gout of right foot without tophus   [facility] [place], [place] T, NP   2 months ago Idiopathic chronic gout of right foot without tophus   [facility] [place], [place] T, NP   3 months ago Urinary symptom or sign   [facility] [place], [place] T, NP   4 months ago Right foot pain   [facility] [place], Lauren A, NP   4 months ago Acute idiopathic gout involving toe of right foot   [facility] [place], Jake Church, NP       Future Appointments             In 1 month Cannady, Dorie Rank, NP [facility]

## 2021-02-22 ENCOUNTER — Other Ambulatory Visit: Payer: Self-pay

## 2021-02-22 ENCOUNTER — Ambulatory Visit
Admission: RE | Admit: 2021-02-22 | Discharge: 2021-02-22 | Disposition: A | Discharge status: Home / Self Care | Payer: 59 | Source: Ambulatory Visit | Attending: Nurse Practitioner | Admitting: Nurse Practitioner

## 2021-02-22 DIAGNOSIS — Z1231 Encounter for screening mammogram for malignant neoplasm of breast: Secondary | ICD-10-CM | POA: Insufficient documentation

## 2021-02-27 ENCOUNTER — Other Ambulatory Visit: Payer: Self-pay | Admitting: Nurse Practitioner

## 2021-02-27 DIAGNOSIS — N6489 Other specified disorders of breast: Secondary | ICD-10-CM

## 2021-02-27 DIAGNOSIS — R928 Other abnormal and inconclusive findings on diagnostic imaging of breast: Secondary | ICD-10-CM

## 2021-03-01 ENCOUNTER — Other Ambulatory Visit: Payer: Self-pay | Admitting: Nurse Practitioner

## 2021-03-02 NOTE — Telephone Encounter (Signed)
Requested medications are due for refill today ? If to be continued  Requested medications are on the active medication list yes  Last refill 01/15/21  Last visit 12/14/20  Future visit scheduled 03/04/21  Notes to clinic Last OV note states to begin Allopurinol and to decrease this med to prn, please assess.

## 2021-03-04 ENCOUNTER — Other Ambulatory Visit: Payer: Self-pay

## 2021-03-04 ENCOUNTER — Ambulatory Visit
Admission: RE | Admit: 2021-03-04 | Discharge: 2021-03-04 | Disposition: A | Discharge status: Home / Self Care | Payer: 59 | Source: Ambulatory Visit | Attending: Nurse Practitioner | Admitting: Nurse Practitioner

## 2021-03-04 DIAGNOSIS — N6489 Other specified disorders of breast: Secondary | ICD-10-CM | POA: Insufficient documentation

## 2021-03-04 DIAGNOSIS — R928 Other abnormal and inconclusive findings on diagnostic imaging of breast: Secondary | ICD-10-CM | POA: Diagnosis present

## 2021-03-05 ENCOUNTER — Telehealth: Payer: Self-pay

## 2021-03-05 NOTE — Telephone Encounter (Signed)
Copied from CRM [id number]. Topic: Appointment Scheduling - Scheduling Inquiry for Clinic >> Mar 05, 2021  2:41 PM Gaetana Michaelis A wrote: Reason for CRM: The patient would like to know if it is possible to also have a skin tag removed during their visit on 03/29/21  The patient would ideally like to be seen for their follow up and have the skin tag removed but was uncertain if it could be done within the same visit   Routing to provider to advise.

## 2021-03-06 NOTE — Telephone Encounter (Signed)
Called patient unable to leave a message for patient due to patient not having an answering machine set up.

## 2021-03-07 NOTE — Telephone Encounter (Signed)
Left a message for patient to give our office a call back to discuss recommendations per Jolene.

## 2021-03-07 NOTE — Telephone Encounter (Signed)
Left message on patient's voicemail to make patient aware of Jolene recommendations. Per patient it is OK for clinical staff to leave a message on her personal cell voicemail. Message left. Advised patient to give our office a call if she has any questions or concerns.

## 2021-03-23 ENCOUNTER — Encounter: Payer: Self-pay | Admitting: Nurse Practitioner

## 2021-03-23 LAB — COMPREHENSIVE METABOLIC PANEL
ALT: 60 IU/L — ABNORMAL HIGH (ref 0–32)
AST: 42 IU/L — ABNORMAL HIGH (ref 0–40)
Albumin/Globulin Ratio: 2 (ref 1.2–2.2)
Albumin: 4.4 g/dL (ref 3.8–4.9)
Alkaline Phosphatase: 160 IU/L — ABNORMAL HIGH (ref 44–121)
BUN/Creatinine Ratio: 16 (ref 9–23)
BUN: 15 mg/dL (ref 6–24)
Bilirubin Total: 0.8 mg/dL (ref 0.0–1.2)
CO2: 26 mmol/L (ref 20–29)
Calcium: 9.8 mg/dL (ref 8.7–10.2)
Chloride: 104 mmol/L (ref 96–106)
Creatinine, Ser: 0.92 mg/dL (ref 0.57–1.00)
Globulin, Total: 2.2 g/dL (ref 1.5–4.5)
Glucose: 87 mg/dL (ref 70–99)
Potassium: 4.7 mmol/L (ref 3.5–5.2)
Sodium: 143 mmol/L (ref 134–144)
Total Protein: 6.6 g/dL (ref 6.0–8.5)
eGFR: 72 mL/min/{1.73_m2} (ref >59)

## 2021-03-23 LAB — URIC ACID: Uric Acid: 4.1 mg/dL (ref 3.0–7.2)

## 2021-03-23 LAB — GAMMA GT: GGT: 59 IU/L (ref 0–60)

## 2021-03-23 NOTE — Progress Notes (Signed)
Good morning, please let Shanvi know her labs have returned: - GGT -- which was elevated in past at 211 is now normal at 59.  Liver function testing remains mildly elevated -- keep focus on diet. - Uric acid is 4.1, below 5 where rheumatology would like it.  Good news.  Any questions? Keep being awesome!!  Thank you for allowing me to participate in your care.  I appreciate you. Kindest regards, Kemari Narez

## 2021-03-29 ENCOUNTER — Ambulatory Visit: Payer: 59 | Admitting: Nurse Practitioner

## 2021-04-01 ENCOUNTER — Encounter: Payer: Self-pay | Admitting: Nurse Practitioner

## 2021-04-01 ENCOUNTER — Ambulatory Visit: Payer: 59 | Admitting: Nurse Practitioner

## 2021-04-01 ENCOUNTER — Other Ambulatory Visit: Payer: Self-pay

## 2021-04-01 DIAGNOSIS — L989 Disorder of the skin and subcutaneous tissue, unspecified: Secondary | ICD-10-CM | POA: Diagnosis not present

## 2021-04-01 NOTE — Assessment & Plan Note (Signed)
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 Recommend she call dermatology, who she is established with, for removal of both areas.  Discussed skin tag removal can be done by PCP on back, shoulders, etc., but prefer for facial areas that dermatology assess and remove.  She agrees with this plan of care.

## 2021-04-01 NOTE — Progress Notes (Signed)
BP 111/71   Pulse 74   Temp 98.5 F (36.9 C) (Oral)   Wt 153 lb (69.4 kg)   LMP  (LMP Unknown)   SpO2 98%   BMI 28.16 kg/m    Subjective:    Patient ID: Kristen Larson, female    DOB: 12/22/1960, 60 y.o.   MRN: [id number]  HPI: Kristen Larson is a 60 y.o. female  Chief Complaint  Patient presents with   Skin Tag    Patient states she is here for a skin tag removal. Patient states the skin tag is under her lip.    SKIN LESION Just presented this year to under lip area.  Is causing irritation and discomfort, especially with wearing mask.  Is established with [facility], Dr. Kellie Moor.  Has area to left anterior shoulder of concern too, started appearing over the past month.   Duration: months Location: to under lip area Painful:  with mask irritation Itching: yes Onset: gradual Context: not changing Associated signs and symptoms:  History of skin cancer: history of basal cell removal History of precancerous skin lesions: no Family history of skin cancer: no   Relevant past medical, surgical, family and social history reviewed and updated as indicated. Interim medical history since our last visit reviewed. Allergies and medications reviewed and updated.  Review of Systems  Constitutional:  Negative for activity change, appetite change, diaphoresis, fatigue and fever.  Respiratory:  Negative for cough, chest tightness and shortness of breath.   Cardiovascular:  Negative for chest pain, palpitations and leg swelling.  Neurological: Negative.   Psychiatric/Behavioral: Negative.     Per HPI unless specifically indicated above     Objective:    BP 111/71   Pulse 74   Temp 98.5 F (36.9 C) (Oral)   Wt 153 lb (69.4 kg)   LMP  (LMP Unknown)   SpO2 98%   BMI 28.16 kg/m   Wt Readings from Last 3 Encounters:  04/01/21 153 lb (69.4 kg)  12/14/20 156 lb 9.6 oz (71 kg)  11/30/20 157 lb 3.2 oz (71.3 kg)    Physical Exam Vitals and nursing note reviewed.   Constitutional:      General: She is awake. She is not in acute distress.    Appearance: She is well-developed and well-groomed. She is obese. She is not ill-appearing.  HENT:     Head: Normocephalic.     Right Ear: Hearing normal.     Left Ear: Hearing normal.  Eyes:     General: Lids are normal.        Right eye: No discharge.        Left eye: No discharge.     Conjunctiva/sclera: Conjunctivae normal.     Pupils: Pupils are equal, round, and reactive to light.  Neck:     Thyroid: No thyromegaly.     Vascular: No carotid bruit.  Cardiovascular:     Rate and Rhythm: Normal rate and regular rhythm.     Heart sounds: Normal heart sounds. No murmur heard.   No gallop.  Pulmonary:     Effort: Pulmonary effort is normal.     Breath sounds: Normal breath sounds.  Abdominal:     General: Bowel sounds are normal.     Palpations: Abdomen is soft.  Musculoskeletal:     Cervical back: Normal range of motion and neck supple.     Right lower leg: No edema.     Left lower leg: No edema.  Lymphadenopathy:  Cervical: No cervical adenopathy.  Skin:    General: Skin is warm and dry.          Comments: Small, pin size, skin tag underneath lip to left side.  Skin intact with no erythema.  Neurological:     Mental Status: She is alert and oriented to person, place, and time.  Psychiatric:        Attention and Perception: Attention normal.        Mood and Affect: Mood normal.        Speech: Speech normal.        Behavior: Behavior normal. Behavior is cooperative.        Thought Content: Thought content normal.    Results for orders placed or performed in visit on 11/30/20  Microscopic Examination   Urine  Result Value Ref Range   WBC, UA None seen 0 - 5 /hpf   RBC 0-2 0 - 2 /hpf   Epithelial Cells (non renal) 0-10 0 - 10 /hpf   Mucus, UA Present (A) Not Estab.   Bacteria, UA None seen None seen/Few  Urinalysis, Routine w reflex microscopic  Result Value Ref Range   Specific  Gravity, UA 1.025 1.005 - 1.030   pH, UA 5.0 5.0 - 7.5   Color, UA Yellow Yellow   Appearance Ur Clear Clear   Leukocytes,UA Negative Negative   Protein,UA Negative Negative/Trace   Glucose, UA Negative Negative   Ketones, UA 1+ (A) Negative   RBC, UA 1+ (A) Negative   Bilirubin, UA Negative Negative   Urobilinogen, Ur 0.2 0.2 - 1.0 mg/dL   Nitrite, UA Negative Negative   Microscopic Examination See below:   CBC with Differential/Platelet  Result Value Ref Range   WBC 5.5 3.4 - 10.8 x10E3/uL   RBC 5.14 3.77 - 5.28 x10E6/uL   Hemoglobin 14.7 11.1 - 15.9 g/dL   Hematocrit 45.0 34.0 - 46.6 %   MCV 88 79 - 97 fL   MCH 28.6 26.6 - 33.0 pg   MCHC 32.7 31.5 - 35.7 g/dL   RDW 12.9 11.7 - 15.4 %   Platelets 238 150 - 450 x10E3/uL   Neutrophils 67 Not Estab. %   Lymphs 21 Not Estab. %   Monocytes 7 Not Estab. %   Eos 3 Not Estab. %   Basos 1 Not Estab. %   Neutrophils Absolute 3.8 1.4 - 7.0 x10E3/uL   Lymphocytes Absolute 1.1 0.7 - 3.1 x10E3/uL   Monocytes Absolute 0.4 0.1 - 0.9 x10E3/uL   EOS (ABSOLUTE) 0.2 0.0 - 0.4 x10E3/uL   Basophils Absolute 0.0 0.0 - 0.2 x10E3/uL   Immature Granulocytes 1 Not Estab. %   Immature Grans (Abs) 0.0 0.0 - 0.1 x10E3/uL  Comprehensive metabolic panel  Result Value Ref Range   Glucose 72 65 - 99 mg/dL   BUN 13 6 - 24 mg/dL   Creatinine, Ser 0.94 0.57 - 1.00 mg/dL   eGFR 70 >59 mL/min/1.73   BUN/Creatinine Ratio 14 9 - 23   Sodium 141 134 - 144 mmol/L   Potassium 4.2 3.5 - 5.2 mmol/L   Chloride 102 96 - 106 mmol/L   CO2 23 20 - 29 mmol/L   Calcium 9.5 8.7 - 10.2 mg/dL   Total Protein 6.5 6.0 - 8.5 g/dL   Albumin 4.3 3.8 - 4.9 g/dL   Globulin, Total 2.2 1.5 - 4.5 g/dL   Albumin/Globulin Ratio 2.0 1.2 - 2.2   Bilirubin Total 0.8 0.0 - 1.2 mg/dL  Alkaline Phosphatase 138 (H) 44 - 121 IU/L   AST 22 0 - 40 IU/L   ALT 19 0 - 32 IU/L  Uric acid  Result Value Ref Range   Uric Acid 5.4 3.0 - 7.2 mg/dL      Assessment & Plan:   Problem List  Items Addressed This Visit       Musculoskeletal and Integument   Skin lesions    Skin tag to under left lip and small p[place] lesion to anterior left shoulder. Recommend she call dermatology, who she is established with, for removal of both areas.  Discussed skin tag removal can be done by PCP on back, shoulders, etc., but prefer for facial areas that dermatology assess and remove.  She agrees with this plan of care.        Follow up plan: Return in about 15 weeks (around 07/15/2021) for Annual physical.

## 2021-04-01 NOTE — Patient Instructions (Addendum)
Call dermatology -- [phone]  Skin Tag, Adult A skin tag (acrochordon) is a soft, extra growth of skin. Most skin tags are skin-colored and rarely bigger than a pencil eraser. They commonly form in areas where there is frequent rubbing, or friction, on the skin. This may be where there are folds in the skin, such as the eyelids, neck, armpit, or groin. Skin tags are not dangerous, and they do not spread from person to person (are not contagious). You may have one skin tag or several. Skin tags do not require treatment. However, your health care provider may recommend removal of a skin tag if it: Gets irritated from clothing or jewelry. Bleeds. Is visible and unsightly. What are the causes? This condition is linked with: Increasing age. Pregnancy. Diabetes. Obesity. What are the signs or symptoms? Skin tags usually do not cause symptoms unless they get irritated by items touching your skin, such as clothing or jewelry. When this happens, you may have pain, itching, or bleeding. How is this diagnosed? This condition is diagnosed with an evaluation from your health care provider. No testing is needed for diagnosis. How is this treated? Treatment for this condition depends on whether you have symptoms. If a skin tag needs to be removed, your health care provider can remove it with: A simple surgical procedure using scissors. A procedure that involves freezing your skin tag with a gas in liquid form (liquid nitrogen). A procedure that uses heat to destroy your skin tag (electrodessication). Your health care provider may also remove your skin tag if it is visible or unsightly, Follow these instructions at home: Watch for any changes in your skin tag. A normal skin tag does not require any other special care at home. Take over-the-counter and prescription medicines only as told by your health care provider. Keep all follow-up visits as told by your health care provider. This is  important. Contact a health care provider if: You have a skin tag that: Becomes painful. Changes color. Bleeds. Swells. Summary Skin tags are soft, extra growths of skin found in areas of frequent rubbing or friction. Skin tags usually do not cause symptoms. If symptoms occur, you may have pain, itching, or bleeding. If your skin tag causes symptoms or is unsightly, your health care provider can remove it. This information is not intended to replace advice given to you by your health care provider. Make sure you discuss any questions you have with your health care provider. Document Revised: 03/28/2019 Document Reviewed: 03/28/2019 Elsevier Patient Education  2022 [employer].

## 2021-06-13 ENCOUNTER — Other Ambulatory Visit: Payer: Self-pay | Admitting: Nurse Practitioner

## 2021-06-14 NOTE — Telephone Encounter (Signed)
Requested Prescriptions  Pending Prescriptions Disp Refills   fluticasone (FLONASE) 50 MCG/ACT nasal spray [Pharmacy Med Name: FLUTICASONE PROP 50 MCG SPRAY] 16 mL 3    Sig: SPRAY 2 SPRAYS INTO EACH NOSTRIL EVERY DAY     Ear, Nose, and Throat: Nasal Preparations - Corticosteroids Passed - 06/13/2021 12:29 PM      Passed - Valid encounter within last 12 months    Recent Outpatient Visits          2 months ago Skin lesions   [facility] [place], Jolene T, NP   6 months ago Idiopathic chronic gout of right foot without tophus   [facility] [place], Jolene T, NP   6 months ago Idiopathic chronic gout of right foot without tophus   [facility] [place], [place] T, NP   7 months ago Urinary symptom or sign   [facility] [place], [place] T, NP   8 months ago Right foot pain   [facility] [place], Jake Church, NP      Future Appointments            In 1 month Cannady, Dorie Rank, NP [facility]

## 2021-07-10 ENCOUNTER — Other Ambulatory Visit: Payer: Self-pay | Admitting: Nurse Practitioner

## 2021-07-10 NOTE — Telephone Encounter (Signed)
Requesting early, current rx is 90 day supply. Requested Prescriptions  Pending Prescriptions Disp Refills   lisinopril (ZESTRIL) 5 MG tablet [Pharmacy Med Name: LISINOPRIL 5 MG TABLET] 30 tablet 11    Sig: TAKE 1 TABLET (5 MG TOTAL) BY MOUTH DAILY.     Cardiovascular:  ACE Inhibitors Passed - 07/10/2021  2:48 AM      Passed - Cr in normal range and within 180 days    Creatinine  Date Value Ref Range Status  05/25/2014 1.03 0.60 - 1.30 mg/dL Final   Creatinine, Ser  Date Value Ref Range Status  03/22/2021 0.92 0.57 - 1.00 mg/dL Final         Passed - K in normal range and within 180 days    Potassium  Date Value Ref Range Status  03/22/2021 4.7 3.5 - 5.2 mmol/L Final  05/24/2014 3.9 3.5 - 5.1 mmol/L Final         Passed - Patient is not pregnant      Passed - Last BP in normal range    BP Readings from Last 1 Encounters:  04/01/21 111/71         Passed - Valid encounter within last 6 months    Recent Outpatient Visits          3 months ago Skin lesions   [facility] [place], Jolene T, NP   6 months ago Idiopathic chronic gout of right foot without tophus   [facility] [place], Jolene T, NP   7 months ago Idiopathic chronic gout of right foot without tophus   [facility] [place], [place] T, NP   8 months ago Urinary symptom or sign   [facility], [place] T, NP   9 months ago Right foot pain   [facility] [place], Jake Church, NP      Future Appointments            In 5 days Cannady, Dorie Rank, NP [employer]

## 2021-07-11 ENCOUNTER — Other Ambulatory Visit: Payer: Self-pay | Admitting: Nurse Practitioner

## 2021-07-11 NOTE — Telephone Encounter (Signed)
Requested medication (s) are due for refill today:   Requested medication (s) are on the active medication list: Yes  Last refill:  2.4.22  Future visit scheduled: Yes  Notes to clinic:  Pharmacy requesting new prescription.    Requested Prescriptions  Pending Prescriptions Disp Refills   lisinopril (ZESTRIL) 5 MG tablet [Pharmacy Med Name: LISINOPRIL 5 MG TABLET] 30 tablet 11    Sig: Take 1 tablet (5 mg total) by mouth daily.     Cardiovascular:  ACE Inhibitors Passed - 07/11/2021 10:18 AM      Passed - Cr in normal range and within 180 days    Creatinine  Date Value Ref Range Status  05/25/2014 1.03 0.60 - 1.30 mg/dL Final   Creatinine, Ser  Date Value Ref Range Status  03/22/2021 0.92 0.57 - 1.00 mg/dL Final          Passed - K in normal range and within 180 days    Potassium  Date Value Ref Range Status  03/22/2021 4.7 3.5 - 5.2 mmol/L Final  05/24/2014 3.9 3.5 - 5.1 mmol/L Final          Passed - Patient is not pregnant      Passed - Last BP in normal range    BP Readings from Last 1 Encounters:  04/01/21 111/71          Passed - Valid encounter within last 6 months    Recent Outpatient Visits           3 months ago Skin lesions   [facility] [place], Jolene T, NP   6 months ago Idiopathic chronic gout of right foot without tophus   [facility] [place], Jolene T, NP   7 months ago Idiopathic chronic gout of right foot without tophus   [facility] [place], [place] T, NP   8 months ago Urinary symptom or sign   [facility], [place] T, NP   9 months ago Right foot pain   [facility] [place], Jake Church, NP       Future Appointments             In 1 month Cannady, Dorie Rank, NP [facility]

## 2021-07-11 NOTE — Telephone Encounter (Signed)
Duplicate request. Requested Prescriptions  Pending Prescriptions Disp Refills   lisinopril (ZESTRIL) 5 MG tablet [Pharmacy Med Name: LISINOPRIL 5 MG TABLET] 30 tablet 11    Sig: TAKE 1 TABLET (5 MG TOTAL) BY MOUTH DAILY.     Cardiovascular:  ACE Inhibitors Passed - 07/10/2021  3:02 PM      Passed - Cr in normal range and within 180 days    Creatinine  Date Value Ref Range Status  05/25/2014 1.03 0.60 - 1.30 mg/dL Final   Creatinine, Ser  Date Value Ref Range Status  03/22/2021 0.92 0.57 - 1.00 mg/dL Final         Passed - K in normal range and within 180 days    Potassium  Date Value Ref Range Status  03/22/2021 4.7 3.5 - 5.2 mmol/L Final  05/24/2014 3.9 3.5 - 5.1 mmol/L Final         Passed - Patient is not pregnant      Passed - Last BP in normal range    BP Readings from Last 1 Encounters:  04/01/21 111/71         Passed - Valid encounter within last 6 months    Recent Outpatient Visits          3 months ago Skin lesions   [facility] [place], Jolene T, NP   6 months ago Idiopathic chronic gout of right foot without tophus   [facility] [place], Jolene T, NP   7 months ago Idiopathic chronic gout of right foot without tophus   [facility] [place], [place] T, NP   8 months ago Urinary symptom or sign   [facility], [place] T, NP   9 months ago Right foot pain   [facility] [place], Jake Church, NP      Future Appointments            In 1 month Cannady, Dorie Rank, NP [employer]

## 2021-07-15 ENCOUNTER — Encounter: Payer: 59 | Admitting: Nurse Practitioner

## 2021-08-02 ENCOUNTER — Other Ambulatory Visit: Payer: Self-pay | Admitting: Nurse Practitioner

## 2021-08-02 DIAGNOSIS — N6310 Unspecified lump in the right breast, unspecified quadrant: Secondary | ICD-10-CM

## 2021-08-02 DIAGNOSIS — R928 Other abnormal and inconclusive findings on diagnostic imaging of breast: Secondary | ICD-10-CM

## 2021-08-07 ENCOUNTER — Other Ambulatory Visit: Payer: Self-pay | Admitting: Nurse Practitioner

## 2021-08-07 DIAGNOSIS — E039 Hypothyroidism, unspecified: Secondary | ICD-10-CM

## 2021-08-07 NOTE — Telephone Encounter (Signed)
Requested medication (s) are due for refill today: yes ? ?Requested medication (s) are on the active medication list: yes ? ?Last refill:  08/31/20 #90/3 ? ?Future visit scheduled: yes ? ?Notes to clinic:  Unable to refill per protocol due to failed labs, no updated results.  ? ? ?  ?Requested Prescriptions  ?Pending Prescriptions Disp Refills  ? levothyroxine (SYNTHROID) 75 MCG tablet [Pharmacy Med Name: LEVOTHYROXINE 75 MCG TABLET] 30 tablet 11  ?  Sig: TAKE 1 TABLET BY MOUTH DAILY BEFORE BREAKFAST.  ?  ? Endocrinology:  Hypothyroid Agents Failed - 08/07/2021  1:44 AM  ?  ?  Failed - TSH in normal range and within 360 days  ?  TSH  ?Date Value Ref Range Status  ?07/13/2020 2.780 0.450 - 4.500 uIU/mL Final  ?  ?  ?  ?  Passed - Valid encounter within last 12 months  ?  Recent Outpatient Visits   ? ?      ? 4 months ago Skin lesions  ? [facility] [place], [place] T, NP  ? 7 months ago Idiopathic chronic gout of right foot without tophus  ? [facility] [place], [place] T, NP  ? 8 months ago Idiopathic chronic gout of right foot without tophus  ? [place], [place] T, NP  ? 9 months ago Urinary symptom or sign  ? [place], [place] T, NP  ? 10 months ago Right foot pain  ? [facility], Lauren A, NP  ? ?  ?  ?Future Appointments   ? ?        ? In 2 weeks Cannady, Barbaraann Faster, NP [employer]  ? ?  ? ?  ?  ?  ? ?

## 2021-08-18 DIAGNOSIS — E7800 Pure hypercholesterolemia, unspecified: Secondary | ICD-10-CM | POA: Insufficient documentation

## 2021-08-18 NOTE — Patient Instructions (Incomplete)
DASH Eating Plan °DASH stands for Dietary Approaches to Stop Hypertension. The DASH eating plan is a healthy eating plan that has been shown to: °Reduce high blood pressure (hypertension). °Reduce your risk for type 2 diabetes, heart disease, and stroke. °Help with weight loss. °What are tips for following this plan? °Reading food labels °Check food labels for the amount of salt (sodium) per serving. Choose foods with less than 5 percent of the Daily Value of sodium. Generally, foods with less than 300 milligrams (mg) of sodium per serving fit into this eating plan. °To find whole grains, look for the word "whole" as the first word in the ingredient list. °Shopping °Buy products labeled as "low-sodium" or "no salt added." °Buy fresh foods. Avoid canned foods and pre-made or frozen meals. °Cooking °Avoid adding salt when cooking. Use salt-free seasonings or herbs instead of table salt or sea salt. Check with your health care provider or pharmacist before using salt substitutes. °Do not fry foods. Cook foods using healthy methods such as baking, boiling, grilling, roasting, and broiling instead. °Cook with heart-healthy oils, such as olive, canola, avocado, soybean, or sunflower oil. °Meal planning ° °Eat a balanced diet that includes: °4 or more servings of fruits and 4 or more servings of vegetables each day. Try to fill one-half of your plate with fruits and vegetables. °6-8 servings of whole grains each day. °Less than 6 oz (170 g) of lean meat, poultry, or fish each day. A 3-oz (85-g) serving of meat is about the same size as a deck of cards. One egg equals 1 oz (28 g). °2-3 servings of low-fat dairy each day. One serving is 1 cup (237 mL). °1 serving of nuts, seeds, or beans 5 times each week. °2-3 servings of heart-healthy fats. Healthy fats called omega-3 fatty acids are found in foods such as walnuts, flaxseeds, fortified milks, and eggs. These fats are also found in cold-water fish, such as sardines, salmon,  and mackerel. °Limit how much you eat of: °Canned or prepackaged foods. °Food that is high in trans fat, such as some fried foods. °Food that is high in saturated fat, such as fatty meat. °Desserts and other sweets, sugary drinks, and other foods with added sugar. °Full-fat dairy products. °Do not salt foods before eating. °Do not eat more than 4 egg yolks a week. °Try to eat at least 2 vegetarian meals a week. °Eat more home-cooked food and less restaurant, buffet, and fast food. °Lifestyle °When eating at a restaurant, ask that your food be prepared with less salt or no salt, if possible. °If you drink alcohol: °Limit how much you use to: °0-1 drink a day for women who are not pregnant. °0-2 drinks a day for men. °Be aware of how much alcohol is in your drink. In the [place] one drink equals one 12 oz bottle of beer (355 mL), one 5 oz glass of wine (148 mL), or one 1½ oz glass of hard liquor (44 mL). °General information °Avoid eating more than 2,300 mg of salt a day. If you have hypertension, you may need to reduce your sodium intake to 1,500 mg a day. °Work with your health care provider to maintain a healthy body weight or to lose weight. Ask what an ideal weight is for you. °Get at least 30 minutes of exercise that causes your heart to beat faster (aerobic exercise) most days of the week. Activities may include walking, swimming, or biking. °Work with your health care provider or dietitian to   adjust your eating plan to your individual calorie needs. °What foods should I eat? °Fruits °All fresh, dried, or frozen fruit. Canned fruit in natural juice (without added sugar). °Vegetables °Fresh or frozen vegetables (raw, steamed, roasted, or grilled). Low-sodium or reduced-sodium tomato and vegetable juice. Low-sodium or reduced-sodium tomato sauce and tomato paste. Low-sodium or reduced-sodium canned vegetables. °Grains °Whole-grain or whole-wheat bread. Whole-grain or whole-wheat pasta. Hamidi rice. Oatmeal. Quinoa.  Bulgur. Whole-grain and low-sodium cereals. Pita bread. Low-fat, low-sodium crackers. Whole-wheat flour tortillas. °Meats and other proteins °Skinless chicken or turkey. Ground chicken or turkey. Pork with fat trimmed off. Fish and seafood. Egg whites. Dried beans, peas, or lentils. Unsalted nuts, nut butters, and seeds. Unsalted canned beans. Lean cuts of beef with fat trimmed off. Low-sodium, lean precooked or cured meat, such as sausages or meat loaves. °Dairy °Low-fat (1%) or fat-free (skim) milk. Reduced-fat, low-fat, or fat-free cheeses. Nonfat, low-sodium ricotta or cottage cheese. Low-fat or nonfat yogurt. Low-fat, low-sodium cheese. °Fats and oils °Soft margarine without trans fats. Vegetable oil. Reduced-fat, low-fat, or light mayonnaise and salad dressings (reduced-sodium). Canola, safflower, olive, avocado, soybean, and sunflower oils. Avocado. °Seasonings and condiments °Herbs. Spices. Seasoning mixes without salt. °Other foods °Unsalted popcorn and pretzels. Fat-free sweets. °The items listed above may not be a complete list of foods and beverages you can eat. Contact a dietitian for more information. °What foods should I avoid? °Fruits °Canned fruit in a light or heavy syrup. Fried fruit. Fruit in cream or butter sauce. °Vegetables °Creamed or fried vegetables. Vegetables in a cheese sauce. Regular canned vegetables (not low-sodium or reduced-sodium). Regular canned tomato sauce and paste (not low-sodium or reduced-sodium). Regular tomato and vegetable juice (not low-sodium or reduced-sodium). Pickles. Olives. °Grains °Baked goods made with fat, such as croissants, muffins, or some breads. Dry pasta or rice meal packs. °Meats and other proteins °Fatty cuts of meat. Ribs. Fried meat. Bacon. Bologna, salami, and other precooked or cured meats, such as sausages or meat loaves. Fat from the back of a pig (fatback). Bratwurst. Salted nuts and seeds. Canned beans with added salt. Canned or smoked fish.  Whole eggs or egg yolks. Chicken or turkey with skin. °Dairy °Whole or 2% milk, cream, and half-and-half. Whole or full-fat cream cheese. Whole-fat or sweetened yogurt. Full-fat cheese. Nondairy creamers. Whipped toppings. Processed cheese and cheese spreads. °Fats and oils °Butter. Stick margarine. Lard. Shortening. Ghee. Bacon fat. Tropical oils, such as coconut, palm kernel, or palm oil. °Seasonings and condiments °Onion salt, garlic salt, seasoned salt, table salt, and sea salt. Worcestershire sauce. Tartar sauce. Barbecue sauce. Teriyaki sauce. Soy sauce, including reduced-sodium. Steak sauce. Canned and packaged gravies. Fish sauce. Oyster sauce. Cocktail sauce. Store-bought horseradish. Ketchup. Mustard. Meat flavorings and tenderizers. Bouillon cubes. Hot sauces. Pre-made or packaged marinades. Pre-made or packaged taco seasonings. Relishes. Regular salad dressings. °Other foods °Salted popcorn and pretzels. °The items listed above may not be a complete list of foods and beverages you should avoid. Contact a dietitian for more information. °Where to find more information °National Heart, Lung, and Blood Institute: www.nhlbi.nih.gov °American Heart Association: www.heart.org °Academy of Nutrition and Dietetics: www.eatright.org °National Kidney Foundation: www.kidney.org °Summary °The DASH eating plan is a healthy eating plan that has been shown to reduce high blood pressure (hypertension). It may also reduce your risk for type 2 diabetes, heart disease, and stroke. °When on the DASH eating plan, aim to eat more fresh fruits and vegetables, whole grains, lean proteins, low-fat dairy, and heart-healthy fats. °With the DASH   eating plan, you should limit salt (sodium) intake to 2,300 mg a day. If you have hypertension, you may need to reduce your sodium intake to 1,500 mg a day. °Work with your health care provider or dietitian to adjust your eating plan to your individual calorie needs. °This information is not  intended to replace advice given to you by your health care provider. Make sure you discuss any questions you have with your health care provider. °Document Revised: 04/29/2019 Document Reviewed: 04/29/2019 °Elsevier Patient Education © 2022 [employer]. ° °

## 2021-08-20 ENCOUNTER — Ambulatory Visit: Payer: Self-pay | Admitting: *Deleted

## 2021-08-20 NOTE — Telephone Encounter (Signed)
Noted  

## 2021-08-20 NOTE — Telephone Encounter (Signed)
?  Chief Complaint: tickbite- possible deer tick ?Symptoms: small red bump- looks like mosquito bite, itching ?Frequency: Saturday- Sunday- possibly attached 12 hours ?Pertinent Negatives: Patient denies fever, rash ?Disposition: [] ED /[] Urgent Care (no appt availability in office) / [] Appointment(In office/virtual)/ []  [facility]/ [x] Home Care/ [] Refused Recommended Disposition /[] [facility]/ []  Follow-up with PCP ?Additional Notes: Patient has appointment in the office on Friday- advised home care- will send message for further instructions from provider ?

## 2021-08-20 NOTE — Telephone Encounter (Signed)
Reason for Disposition ? Deer tick bite with no complications ? ?Answer Assessment - Initial Assessment Questions ?1. TYPE of TICK: "Is it a wood tick or a deer tick?" (e.g., deer tick, wood tick; unsure) ?    Tiny tick ?2. SIZE of TICK: "How big is the tick?" (e.g., size of poppy seed, apple seed, watermelon seed; unsure) Note: Deer ticks can be the size of a poppy seed (nymph) or an apple seed (adult).   ?    tiny ?3. ENGORGED: "Did the tick look flat or engorged (full, swollen)?" (e.g., flat, engorged; unsure) ?    no ?4. LOCATION: "Where is the tick bite located?"  ?    Lower leg ?5. ONSET: "How long do you think the tick was attached before you removed it?" (e.g., 5 hours, 2 days)  ?    Saturday- 12 hours ?6. APPEARANCE of BITE or RASH: "What does the site look like?" ?    Red circle- small- looks like mosquito bite, itching ?7. PREGNANCY: "Is there any chance you are pregnant?" "When was your last menstrual period?" ?    na ? ?Protocols used: Tick Bite-A-AH ? ?

## 2021-08-23 ENCOUNTER — Other Ambulatory Visit: Payer: Self-pay

## 2021-08-23 ENCOUNTER — Ambulatory Visit (INDEPENDENT_AMBULATORY_CARE_PROVIDER_SITE_OTHER): Payer: 59 | Admitting: Nurse Practitioner

## 2021-08-23 ENCOUNTER — Encounter: Payer: Self-pay | Admitting: Nurse Practitioner

## 2021-08-23 VITALS — BP 107/71 | HR 60 | Temp 98.2°F | Ht 62.0 in | Wt 149.8 lb

## 2021-08-23 DIAGNOSIS — E039 Hypothyroidism, unspecified: Secondary | ICD-10-CM | POA: Diagnosis not present

## 2021-08-23 DIAGNOSIS — Z0000 Encounter for general adult medical examination without abnormal findings: Secondary | ICD-10-CM | POA: Diagnosis not present

## 2021-08-23 DIAGNOSIS — M1A00X0 Idiopathic chronic gout, unspecified site, without tophus (tophi): Secondary | ICD-10-CM

## 2021-08-23 DIAGNOSIS — K219 Gastro-esophageal reflux disease without esophagitis: Secondary | ICD-10-CM

## 2021-08-23 DIAGNOSIS — R748 Abnormal levels of other serum enzymes: Secondary | ICD-10-CM

## 2021-08-23 DIAGNOSIS — E559 Vitamin D deficiency, unspecified: Secondary | ICD-10-CM

## 2021-08-23 DIAGNOSIS — E7800 Pure hypercholesterolemia, unspecified: Secondary | ICD-10-CM | POA: Diagnosis not present

## 2021-08-23 DIAGNOSIS — J301 Allergic rhinitis due to pollen: Secondary | ICD-10-CM

## 2021-08-23 DIAGNOSIS — I10 Essential (primary) hypertension: Secondary | ICD-10-CM | POA: Diagnosis not present

## 2021-08-23 DIAGNOSIS — Z6829 Body mass index (BMI) 29.0-29.9, adult: Secondary | ICD-10-CM

## 2021-08-23 MED ORDER — TRIAMCINOLONE ACETONIDE 0.1 % EX CREA: 1.0000 "application " | TOPICAL_CREAM | Freq: Two times a day (BID) | CUTANEOUS | 0 refills | Status: DC

## 2021-08-23 MED ORDER — FLUTICASONE PROPIONATE 50 MCG/ACT NA SUSP: NASAL | 3 refills | Status: DC

## 2021-08-23 MED ORDER — ALLOPURINOL 100 MG PO TABS: 200.0000 mg | ORAL_TABLET | Freq: Every day | ORAL | 4 refills | Status: DC

## 2021-08-23 MED ORDER — COLCHICINE 0.6 MG PO TABS
ORAL_TABLET | ORAL | 3 refills | Status: DC
Start: 2021-08-23 — End: 2024-03-17

## 2021-08-23 NOTE — Assessment & Plan Note (Addendum)
Recheck alk phos and GGT today.  Consider u/s in future if continued elevations or symptoms present. ?

## 2021-08-23 NOTE — Assessment & Plan Note (Signed)
Chronic, ongoing.  Thyroid labs today.  Continue current medication regimen and adjust as needed based on labs.   ?

## 2021-08-23 NOTE — Assessment & Plan Note (Signed)
Chronic, ongoing.  Will continue Colchicine as needed only for flares and daily Allopurinol 200 MG daily for prevention, may increase to 300 MG if needed in future.  Recommend monitoring diet and low purine diet focus at home.  Return in 6 months, labs today. ?

## 2021-08-23 NOTE — Assessment & Plan Note (Signed)
BMI 27.40.  Recommended eating smaller high protein, low fat meals more frequently and exercising 30 mins a day 5 times a week with a goal of 10-15lb weight loss in the next 3 months. Patient voiced their understanding and motivation to adhere to these recommendations. ? ?

## 2021-08-23 NOTE — Progress Notes (Signed)
? ?BP 107/71   Pulse 60   Temp 98.2 ?F (36.8 ?C)   Ht '5\' 2"'  (1.575 m)   Wt 149 lb 12.8 oz (67.9 kg)   LMP  (LMP Unknown)   SpO2 100%   BMI 27.40 kg/m?   ? ?Subjective:  ? ? Patient ID: Kristen Larson, female    DOB: 01-11-1961, 61 y.o.   MRN: [id number] ? ?HPI: ?Kristen Larson is a 61 y.o. female presenting on 08/23/2021 for comprehensive medical examination. Current medical complaints include: tick bite ? ?She currently lives with: self ?Menopausal Symptoms: no  ? ?HYPERTENSION  ?Continues on Lisinopril.  ?Satisfied with current treatment? yes ?Duration of hypertension: chronic ?BP monitoring frequency: not checking ?BP range:  ?BP medication side effects: no ?Aspirin: yes ?Recent stressors: no ?Recurrent headaches: no ?Visual changes: no ?Palpitations: no ?Dyspnea: no ?Chest pain: no ?Lower extremity edema: no ?Dizzy/lightheaded: no  ?The 10-year ASCVD risk score (Arnett DK, et al., 2019) is: 3% ?  Values used to calculate the score: ?    Age: 27 years ?    Sex: Female ?    Is Non-Hispanic African American: No ?    Diabetic: No ?    Tobacco smoker: No ?    Systolic Blood Pressure: 195 mmHg ?    Is BP treated: Yes ?    HDL Cholesterol: 50 mg/dL ?    Total Cholesterol: 197 mg/dL  ? ?HYPOTHYROIDISM ?Continues on Levothyroxine 75 MCG. ?Thyroid control status:stable ?Satisfied with current treatment? yes ?Medication side effects: no ?Medication compliance: good compliance ?Etiology of hypothyroidism:  ?Recent dose adjustment:no ?Fatigue: no ?Cold intolerance: no ?Heat intolerance: no ?Weight gain: no ?Weight loss: no ?Constipation: no ?Diarrhea/loose stools: no ?Palpitations: no ?Lower extremity edema: no ?Anxiety/depressed mood: no  ? ?GERD ?On review has history of elevation in alk phos being monitored.  Had gall bladder removed years ago.  Takes occasional Pepcid. ?GERD control status: uncontrolled  ?Satisfied with current treatment? no ?Heartburn frequency: once or twice a week ?Medication side effects: no   ?Previous GERD medications: none ?Antacid use frequency:  none ?Nature: reflux ?Location: epigastric ?Heartburn duration: years ?Alleviatiating factors:  nothing ?Aggravating factors: certain foods ?Dysphagia: no ?Odynophagia:  no ?Hematemesis: no ?Blood in stool: no ?EGD: no  ? ?GOUT ?Taking Allopurinol 100 MG daily.  Saw rheumatology on 01/14/21 last.  No recent gout flares. ?Duration: chronic ?Swelling: no ?Redness: no ?Trauma: no ?Recent dietary change or indiscretion: no ?Fevers: no ?Nausea/vomiting: no ?Aggravating factors: foods ?Alleviating factors: Allopurinol ?Status:  stable ?Treatments attempted: as above ? ?TICK BITE ?Noticed Saturday evening and removed Sunday morning.   ?Duration: 6 days ?Location: left lower leg ?Onset: 5 days ?Itching: yes ?Status: stable ?Treatments attempted:  ?Fever: no ?Chills: no ?headache: no ?Muscle pain: no ?Rash: no  ? ?Depression Screen done today and results listed below:  ?Depression screen [facility] 2/9 08/23/2021 09/13/2020 07/13/2020 06/13/2019 01/26/2018  ?Decreased Interest 0 0 0 0 0  ?Down, Depressed, Hopeless 0 0 0 0 0  ?PHQ - 2 Score 0 0 0 0 0  ?Altered sleeping 1 - 0 0 1  ?Tired, decreased energy 0 - 0 0 0  ?Change in appetite 0 - 0 0 0  ?Feeling bad or failure about yourself  0 - 0 0 0  ?Trouble concentrating 0 - 0 0 0  ?Moving slowly or fidgety/restless 0 - 0 0 0  ?Suicidal thoughts 0 - 0 0 0  ?PHQ-9 Score 1 - 0 0 1  ? ? ?  Fall Risk 06/13/2019 07/13/2020 09/13/2020 08/23/2021 08/23/2021  ?Falls in the past year? 0 0 0 0 0  ?Was there an injury with Fall? 0 - - 0 0  ?Fall Risk Category Calculator 0 - - 0 0  ?Fall Risk Category Low - - Low Low  ?Patient Fall Risk Level Low fall risk - - Low fall risk Low fall risk  ?Patient at Risk for Falls Due to - - - No Fall Risks No Fall Risks  ?Fall risk Follow up - - - Falls evaluation completed Education provided  ?  ?Functional Status Survey: ?Is the patient deaf or have difficulty hearing?: No ?Does the patient have difficulty seeing, even  when wearing glasses/contacts?: No ?Does the patient have difficulty concentrating, remembering, or making decisions?: No ?Does the patient have difficulty walking or climbing stairs?: No ?Does the patient have difficulty dressing or bathing?: No ?Does the patient have difficulty doing errands alone such as visiting a doctor's office or shopping?: No  ? ? ?Past Medical History:  ?Past Medical History:  ?Diagnosis Date  ? Allergy   ? GERD (gastroesophageal reflux disease)   ? Gout   ? Hypertension   ? Thyroid disease   ? ? ?Surgical History:  ?Past Surgical History:  ?Procedure Laterality Date  ? CHOLECYSTECTOMY  05/25/14  ? TONSILLECTOMY AND ADENOIDECTOMY    ? ? ?Medications:  ?Current Outpatient Medications on File Prior to Visit  ?Medication Sig  ? aspirin 81 MG tablet Take 81 mg by mouth daily.  ? indomethacin (INDOCIN) 50 MG capsule Take 50 mg by mouth 2 (two) times daily with a meal.  ? levothyroxine (SYNTHROID) 75 MCG tablet TAKE 1 TABLET BY MOUTH DAILY BEFORE BREAKFAST.  ? lisinopril (ZESTRIL) 5 MG tablet TAKE 1 TABLET (5 MG TOTAL) BY MOUTH DAILY.  ? Multiple Vitamin (MULTIVITAMIN) tablet Take 1 tablet by mouth daily.  ? ?No current facility-administered medications on file prior to visit.  ? ? ?Allergies:  ?Allergies  ?Allergen Reactions  ? Penicillins Rash  ? ? ?Social History:  ?Social History  ? ?Socioeconomic History  ? Marital status: Married  ?  Spouse name: Not on file  ? Number of children: Not on file  ? Years of education: Not on file  ? Highest education level: Not on file  ?Occupational History  ? Not on file  ?Tobacco Use  ? Smoking status: Never  ? Smokeless tobacco: Never  ?Vaping Use  ? Vaping Use: Never used  ?Substance and Sexual Activity  ? Alcohol use: No  ?  Alcohol/week: 0.0 standard drinks  ? Drug use: No  ? Sexual activity: Yes  ?Other Topics Concern  ? Not on file  ?Social History Narrative  ? Not on file  ? ?Social Determinants of Health  ? ?Financial Resource Strain: Low Risk   ?  Difficulty of Paying Living Expenses: Not hard at all  ?Food Insecurity: No Food Insecurity  ? Worried About Charity fundraiser in the Last Year: Never true  ? Ran Out of Food in the Last Year: Never true  ?Transportation Needs: No Transportation Needs  ? Lack of Transportation (Medical): No  ? Lack of Transportation (Non-Medical): No  ?Physical Activity: Inactive  ? Days of Exercise per Week: 0 days  ? Minutes of Exercise per Session: 0 min  ?Stress: No Stress Concern Present  ? Feeling of Stress : Only a little  ?Social Connections: Moderately Integrated  ? Frequency of Communication with Friends and Family: Twice a  week  ? Frequency of Social Gatherings with Friends and Family: Twice a week  ? Attends Religious Services: 1 to 4 times per year  ? Active Member of Clubs or Organizations: No  ? Attends Archivist Meetings: Never  ? Marital Status: Married  ?Intimate Partner Violence: Not At Risk  ? Fear of Current or Ex-Partner: No  ? Emotionally Abused: No  ? Physically Abused: No  ? Sexually Abused: No  ? ?Social History  ? ?Tobacco Use  ?Smoking Status Never  ?Smokeless Tobacco Never  ? ?Social History  ? ?Substance and Sexual Activity  ?Alcohol Use No  ? Alcohol/week: 0.0 standard drinks  ? ? ?Family History:  ?Family History  ?Problem Relation Age of Onset  ? Heart disease Mother   ? Thyroid disease Mother   ? COPD Father   ? Stroke Father   ? Glaucoma Father   ? Thyroid disease Sister   ? Arthritis Brother   ? Crohn's disease Brother   ? Down syndrome Brother   ? Alzheimer's disease Maternal Grandmother   ? Breast cancer Maternal Grandmother 89  ? Heart disease Maternal Grandfather   ? Lupus Paternal Grandmother   ? Diabetes Paternal Grandfather   ? ? ?Past medical history, surgical history, medications, allergies, family history and social history reviewed with patient today and changes made to appropriate areas of the chart.  ? ?Review of Systems - negative ?All other ROS negative except what is  listed above and in the HPI.  ? ?   ?Objective:  ?  ?BP 107/71   Pulse 60   Temp 98.2 ?F (36.8 ?C)   Ht '5\' 2"'  (1.575 m)   Wt 149 lb 12.8 oz (67.9 kg)   LMP  (LMP Unknown)   SpO2 100%   BMI 27.40 kg/m?   ?Wt Readi

## 2021-08-23 NOTE — Assessment & Plan Note (Signed)
Chronic, ongoing.  Stable with Flonase, continue this regimen.  Refills sent. ?

## 2021-08-23 NOTE — Assessment & Plan Note (Signed)
Chronic, ongoing.  BP well below goal today.  Will continue Lisinopril 5 MG daily and assess -- patient aware to check BP at home and if consistent elevation >130/80 will return to 10 MG dosing. CMP, urine ALB, CBC, and lipid today.  Recommend she monitor BP daily at home and document + focus on DASH diet.  Return in 6 months for follow-up, sooner if elevations in BP at home. ?

## 2021-08-23 NOTE — Assessment & Plan Note (Signed)
Chronic, ongoing.  Recommend continued use of Pepcid as needed and keeping food journal, avoid trigger foods.  Labs today. ?

## 2021-08-23 NOTE — Assessment & Plan Note (Signed)
Noted on labs, recheck levels today and discussed at length with patient.  ASCVD 3%.  Continue diet focus. ?

## 2021-08-24 ENCOUNTER — Other Ambulatory Visit: Payer: Self-pay | Admitting: Nurse Practitioner

## 2021-08-24 DIAGNOSIS — R748 Abnormal levels of other serum enzymes: Secondary | ICD-10-CM

## 2021-08-24 DIAGNOSIS — E039 Hypothyroidism, unspecified: Secondary | ICD-10-CM

## 2021-08-24 DIAGNOSIS — R7989 Other specified abnormal findings of blood chemistry: Secondary | ICD-10-CM

## 2021-08-24 LAB — COMPREHENSIVE METABOLIC PANEL
ALT: 52 IU/L — ABNORMAL HIGH (ref 0–32)
AST: 49 IU/L — ABNORMAL HIGH (ref 0–40)
Albumin/Globulin Ratio: 2.2 (ref 1.2–2.2)
Albumin: 4.6 g/dL (ref 3.8–4.9)
Alkaline Phosphatase: 184 IU/L — ABNORMAL HIGH (ref 44–121)
BUN/Creatinine Ratio: 18 (ref 9–23)
BUN: 17 mg/dL (ref 6–24)
Bilirubin Total: 0.8 mg/dL (ref 0.0–1.2)
CO2: 27 mmol/L (ref 20–29)
Calcium: 9.7 mg/dL (ref 8.7–10.2)
Chloride: 102 mmol/L (ref 96–106)
Creatinine, Ser: 0.93 mg/dL (ref 0.57–1.00)
Globulin, Total: 2.1 g/dL (ref 1.5–4.5)
Glucose: 82 mg/dL (ref 70–99)
Potassium: 4.2 mmol/L (ref 3.5–5.2)
Sodium: 140 mmol/L (ref 134–144)
Total Protein: 6.7 g/dL (ref 6.0–8.5)
eGFR: 71 mL/min/{1.73_m2} (ref >59)

## 2021-08-24 LAB — CBC WITH DIFFERENTIAL/PLATELET
Basophils Absolute: 0.1 10*3/uL (ref 0.0–0.2)
Basos: 1 %
EOS (ABSOLUTE): 0.1 10*3/uL (ref 0.0–0.4)
Eos: 3 %
Hematocrit: 45.9 % (ref 34.0–46.6)
Hemoglobin: 15.3 g/dL (ref 11.1–15.9)
Immature Grans (Abs): 0 10*3/uL (ref 0.0–0.1)
Immature Granulocytes: 1 %
Lymphocytes Absolute: 1.2 10*3/uL (ref 0.7–3.1)
Lymphs: 24 %
MCH: 28.5 pg (ref 26.6–33.0)
MCHC: 33.3 g/dL (ref 31.5–35.7)
MCV: 86 fL (ref 79–97)
Monocytes Absolute: 0.4 10*3/uL (ref 0.1–0.9)
Monocytes: 8 %
Neutrophils Absolute: 3.4 10*3/uL (ref 1.4–7.0)
Neutrophils: 63 %
Platelets: 177 10*3/uL (ref 150–450)
RBC: 5.37 x10E6/uL — ABNORMAL HIGH (ref 3.77–5.28)
RDW: 12.8 % (ref 11.7–15.4)
WBC: 5.2 10*3/uL (ref 3.4–10.8)

## 2021-08-24 LAB — MICROALBUMIN / CREATININE URINE RATIO
Creatinine, Urine: 188.2 mg/dL
Microalb/Creat Ratio: 2 mg/g creat (ref 0–29)
Microalbumin, Urine: 3.4 ug/mL

## 2021-08-24 LAB — LIPID PANEL W/O CHOL/HDL RATIO
Cholesterol, Total: 159 mg/dL (ref 100–199)
HDL: 52 mg/dL (ref >39)
LDL Chol Calc (NIH): 93 mg/dL (ref 0–99)
Triglycerides: 74 mg/dL (ref 0–149)
VLDL Cholesterol Cal: 14 mg/dL (ref 5–40)

## 2021-08-24 LAB — T4, FREE: Free T4: 2.12 ng/dL — ABNORMAL HIGH (ref 0.82–1.77)

## 2021-08-24 LAB — URIC ACID: Uric Acid: 3.6 mg/dL (ref 3.0–7.2)

## 2021-08-24 LAB — VITAMIN D 25 HYDROXY (VIT D DEFICIENCY, FRACTURES): Vit D, 25-Hydroxy: 30.6 ng/mL (ref 30.0–100.0)

## 2021-08-24 LAB — TSH: TSH: 0.524 u[IU]/mL (ref 0.450–4.500)

## 2021-08-24 LAB — GAMMA GT: GGT: 57 IU/L (ref 0–60)

## 2021-08-24 LAB — THYROID PEROXIDASE ANTIBODY: Thyroperoxidase Ab SerPl-aCnc: 13 IU/mL (ref 0–34)

## 2021-08-24 MED ORDER — LEVOTHYROXINE SODIUM 50 MCG PO TABS: 50.0000 ug | ORAL_TABLET | Freq: Every day | ORAL | 12 refills | Status: DC

## 2021-08-24 NOTE — Progress Notes (Signed)
Contacted via [place] -- need lab only visit for 6 weeks please ? ? ? ?Good morning Kristen Larson, your labs have returned: ?- TSH shows lower normal level and Free T4 is high, I would like to try cutting back on your Levothyroxine to 50 MCG dosing.  Please stop 75 MCG and start the 50 MCG I sent in.  I will have staff call Monday to schedule lab only visit to recheck in 6 weeks. ?- CBC shows no anemia or infection. ?- Kidney function, creatinine and eGFR, remains normal.  Liver function is still showing some elevation in AST and ALT + Alkaline Phosphatase elevated.  If ongoing elevations next visit I would recommend we repeat your ultrasound of liver. ?- Remainder of labs are all stable.  Any questions? ?Keep being awesome!!  Thank you for allowing me to participate in your care.  I appreciate you. ?Kindest regards, ?Dennette Faulconer ?

## 2021-09-13 ENCOUNTER — Ambulatory Visit
Admission: RE | Admit: 2021-09-13 | Discharge: 2021-09-13 | Disposition: A | Discharge status: Home / Self Care | Payer: 59 | Source: Ambulatory Visit | Attending: Nurse Practitioner | Admitting: Nurse Practitioner

## 2021-09-13 DIAGNOSIS — N6310 Unspecified lump in the right breast, unspecified quadrant: Secondary | ICD-10-CM

## 2021-09-13 DIAGNOSIS — R928 Other abnormal and inconclusive findings on diagnostic imaging of breast: Secondary | ICD-10-CM

## 2021-10-06 NOTE — Patient Instructions (Incomplete)
Low-Purine Eating Plan A low-purine eating plan involves making food choices to limit your purine intake. Purine is a kind of uric acid. Too much uric acid in your blood can cause certain conditions, such as gout and kidney stones. Eating a low-purine diet may help control these conditions. What are tips for following this plan? Shopping Avoid buying products that contain high-fructose corn syrup. Check for this on food labels. It is commonly found in many processed foods and soft drinks. Be sure to check for it in baked goods such as cookies, canned fruits, and cereals and cereal bars. Avoid buying veal, chicken breast with skin, lamb, and organ meats such as liver. These types of meats tend to have the highest purine content. Choose dairy products. These may lower uric acid levels. Avoid certain types of fish. Not all fish and seafood have high purine content. Examples with high purine content include anchovies, trout, tuna, sardines, and salmon. Avoid buying beverages that contain alcohol, particularly beer and hard liquor. Alcohol can affect the way your body gets rid of uric acid. Meal planning  Learn which foods do or do not affect you. If you find out that a food tends to cause your gout symptoms to flare up, avoid eating that food. You can enjoy foods that do not cause problems. If you have any questions about a food item, talk with your dietitian or health care provider. Reduce the overall amount of meat in your diet. When you do eat meat, choose ones with lower purine content. Include plenty of fruits and vegetables. Although some vegetables may have a high purine content--such as asparagus, mushrooms, spinach, or cauliflower--it has been shown that these do not contribute to uric acid blood levels as much. Consume at least 1 dairy serving a day. This has been shown to decrease uric acid levels. General information If you drink alcohol: Limit how much you have to: 0-1 drink a day for  women who are not pregnant. 0-2 drinks a day for men. Know how much alcohol is in a drink. In the [place] one drink equals one 12 oz bottle of beer (355 mL), one 5 oz glass of wine (148 mL), or one 1 oz glass of hard liquor (44 mL). Drink plenty of water. Try to drink enough to keep your urine pale yellow. Fluids can help remove uric acid from your body. Work with your health care provider and dietitian to develop a plan to achieve or maintain a healthy weight. Losing weight may help reduce uric acid in your blood. What foods are recommended? The following are some types of foods that are good choices when limiting purine intake: Fresh or frozen fruits and vegetables. Whole grains, breads, cereals, and pasta. Rice. Beans, peas, legumes. Nuts and seeds. Dairy products. Fats and oils. The items listed above may not be a complete list. Talk with a dietitian about what dietary choices are best for you. What foods are not recommended? Limit your intake of foods high in purines, including: Beer and other alcohol. Meat-based gravy or sauce. Canned or fresh fish, such as: Anchovies, sardines, herring, salmon, and tuna. Mussels and scallops. Codfish, trout, and haddock. Bacon, veal, chicken breast with skin, and lamb. Organ meats, such as: Liver or kidney. Tripe. Sweetbreads (thymus gland or pancreas). Wild game or goose. Yeast or yeast extract supplements. Drinks sweetened with high-fructose corn syrup, such as soda. Processed foods made with high-fructose corn syrup. The items listed above may not be a complete list of foods   and beverages you should limit. Contact a dietitian for more information. Summary Eating a low-purine diet may help control conditions caused by too much uric acid in the body, such as gout or kidney stones. Choose low-purine foods, limit alcohol, and limit high-fructose corn syrup. You will learn over time which foods do or do not affect you. If you find out that a  food tends to cause your gout symptoms to flare up, avoid eating that food. This information is not intended to replace advice given to you by your health care provider. Make sure you discuss any questions you have with your health care provider. Document Revised: 05/09/2021 Document Reviewed: 05/09/2021 Elsevier Patient Education  2023 Elsevier Inc.  

## 2021-10-09 ENCOUNTER — Ambulatory Visit: Payer: 59 | Admitting: Nurse Practitioner

## 2021-10-09 ENCOUNTER — Encounter: Payer: Self-pay | Admitting: Nurse Practitioner

## 2021-10-09 VITALS — BP 112/69 | HR 58 | Temp 98.1°F | Ht 62.0 in | Wt 151.8 lb

## 2021-10-09 DIAGNOSIS — E039 Hypothyroidism, unspecified: Secondary | ICD-10-CM

## 2021-10-09 DIAGNOSIS — M1A00X0 Idiopathic chronic gout, unspecified site, without tophus (tophi): Secondary | ICD-10-CM

## 2021-10-09 MED ORDER — ALLOPURINOL 300 MG PO TABS: 300.0000 mg | ORAL_TABLET | Freq: Every day | ORAL | 4 refills | Status: DC

## 2021-10-09 NOTE — Progress Notes (Signed)
? ?BP 112/69   Pulse (!) 58   Temp 98.1 ?F (36.7 ?C) (Oral)   Ht '5\' 2"'  (1.575 m)   Wt 151 lb 12.8 oz (68.9 kg)   LMP  (LMP Unknown)   SpO2 98%   BMI 27.76 kg/m?   ? ?Subjective:  ? ? Patient ID: Kristen Larson, female    DOB: [birth year], 61 y.o.   MRN: [id number] ? ?HPI: ?Kristen Larson is a 61 y.o. female ? ?Chief Complaint  ?Patient presents with  ? Gout  ?  Patient is here to follow up on Gout. Patient states the provider that she was seeing at [facility] has retired and she wanted to follow up with Oanh Devivo to make her aware of everything and if the current medication she is on, is what she needs to continue to take. Patient says she hasn't had any recent flare ups with the two medications.   ? ?GOUT ?Her provider at [facility] retired, Dr. Jefm Bryant.  Currently on Allopurinol 200 MG daily + Colchicine daily until could her levels under control.  Indocin as needed.  Has not had a gout flare this year.  Initial visit with Dr. Jefm Bryant on 01/14/21 and he was following uric acid levels.  Last level in March 2023 -- 3.6. ?Duration:chronic ?Right 1st metatarsophalangeal pain: yes ?Left 1st metatarsophalangeal pain: yes ?Right knee pain: no ?Left knee pain: no ?Swelling: no ?Redness: no ?Trauma: no ?Recent dietary change or indiscretion: no ?Fevers: no ?Nausea/vomiting: no ?Aggravating factors: ?Alleviating factors:  ?Status:  stable ?Treatments attempted: ? ?HYPOTHYROIDISM ?Taking Levothyroxine 50 MCG.  Changed recent visit due to labs. ?Thyroid control status:stable ?Satisfied with current treatment? yes ?Medication side effects: no ?Medication compliance: good compliance ?Etiology of hypothyroidism:  ?Recent dose adjustment:no ?Fatigue: no ?Cold intolerance: no ?Heat intolerance: no ?Weight gain: no ?Weight loss: no ?Constipation: no ?Diarrhea/loose stools: no ?Palpitations: no ?Lower extremity edema: no ?Anxiety/depressed mood: no  ?  ? ?Relevant past medical, surgical, family and social history  reviewed and updated as indicated. Interim medical history since our last visit reviewed. ?Allergies and medications reviewed and updated. ? ?Review of Systems  ?Constitutional:  Negative for activity change, appetite change, diaphoresis, fatigue and fever.  ?Respiratory:  Negative for cough, chest tightness and shortness of breath.   ?Cardiovascular:  Negative for chest pain, palpitations and leg swelling.  ?Gastrointestinal: Negative.   ?Neurological: Negative.   ?Psychiatric/Behavioral: Negative.    ? ?Per HPI unless specifically indicated above ? ?   ?Objective:  ?  ?BP 112/69   Pulse (!) 58   Temp 98.1 ?F (36.7 ?C) (Oral)   Ht '5\' 2"'  (1.575 m)   Wt 151 lb 12.8 oz (68.9 kg)   LMP  (LMP Unknown)   SpO2 98%   BMI 27.76 kg/m?   ?Wt Readings from Last 3 Encounters:  ?10/09/21 151 lb 12.8 oz (68.9 kg)  ?08/23/21 149 lb 12.8 oz (67.9 kg)  ?04/01/21 153 lb (69.4 kg)  ?  ?Physical Exam ?Vitals and nursing note reviewed.  ?Constitutional:   ?   General: She is awake. She is not in acute distress. ?   Appearance: She is well-developed and overweight. She is not ill-appearing.  ?HENT:  ?   Head: Normocephalic.  ?   Right Ear: Hearing normal.  ?   Left Ear: Hearing normal.  ?Eyes:  ?   General: Lids are normal.     ?   Right eye: No discharge.     ?  Left eye: No discharge.  ?   Conjunctiva/sclera: Conjunctivae normal.  ?   Pupils: Pupils are equal, round, and reactive to light.  ?Neck:  ?   Vascular: No carotid bruit.  ?Cardiovascular:  ?   Rate and Rhythm: Normal rate and regular rhythm.  ?   Pulses:     ?     Dorsalis pedis pulses are 2+ on the right side and 2+ on the left side.  ?     Posterior tibial pulses are 2+ on the right side and 2+ on the left side.  ?   Heart sounds: Normal heart sounds. No murmur heard. ?  No gallop.  ?Pulmonary:  ?   Effort: Pulmonary effort is normal. No accessory muscle usage or respiratory distress.  ?   Breath sounds: Normal breath sounds.  ?Abdominal:  ?   General: Bowel sounds  are normal.  ?   Palpations: Abdomen is soft.  ?Musculoskeletal:  ?   Cervical back: Normal range of motion and neck supple.  ?   Right lower leg: No edema.  ?   Left lower leg: No edema.  ?   Right foot: Normal range of motion.  ?   Left foot: Normal range of motion.  ?Feet:  ?   Right foot:  ?   Skin integrity: No erythema or warmth.  ?   Toenail Condition: Right toenails are normal.  ?   Left foot:  ?   Skin integrity: Skin integrity normal.  ?   Toenail Condition: Left toenails are normal.  ?Skin: ?   General: Skin is warm and dry.  ?Neurological:  ?   Mental Status: She is alert and oriented to person, place, and time.  ?Psychiatric:     ?   Attention and Perception: Attention normal.     ?   Mood and Affect: Mood normal.     ?   Speech: Speech normal.     ?   Behavior: Behavior normal. Behavior is cooperative.  ? ? ?Results for orders placed or performed in visit on 08/23/21  ?T4, free  ?Result Value Ref Range  ? Free T4 2.12 (H) 0.82 - 1.77 ng/dL  ?Thyroid peroxidase antibody  ?Result Value Ref Range  ? Thyroperoxidase Ab SerPl-aCnc 13 0 - 34 IU/mL  ?CBC with Differential/Platelet  ?Result Value Ref Range  ? WBC 5.2 3.4 - 10.8 x10E3/uL  ? RBC 5.37 (H) 3.77 - 5.28 x10E6/uL  ? Hemoglobin 15.3 11.1 - 15.9 g/dL  ? Hematocrit 45.9 34.0 - 46.6 %  ? MCV 86 79 - 97 fL  ? MCH 28.5 26.6 - 33.0 pg  ? MCHC 33.3 31.5 - 35.7 g/dL  ? RDW 12.8 11.7 - 15.4 %  ? Platelets 177 150 - 450 x10E3/uL  ? Neutrophils 63 Not Estab. %  ? Lymphs 24 Not Estab. %  ? Monocytes 8 Not Estab. %  ? Eos 3 Not Estab. %  ? Basos 1 Not Estab. %  ? Neutrophils Absolute 3.4 1.4 - 7.0 x10E3/uL  ? Lymphocytes Absolute 1.2 0.7 - 3.1 x10E3/uL  ? Monocytes Absolute 0.4 0.1 - 0.9 x10E3/uL  ? EOS (ABSOLUTE) 0.1 0.0 - 0.4 x10E3/uL  ? Basophils Absolute 0.1 0.0 - 0.2 x10E3/uL  ? Immature Granulocytes 1 Not Estab. %  ? Immature Grans (Abs) 0.0 0.0 - 0.1 x10E3/uL  ?Comprehensive metabolic panel  ?Result Value Ref Range  ? Glucose 82 70 - 99 mg/dL  ? BUN 17 6 -  24  mg/dL  ? Creatinine, Ser 0.93 0.57 - 1.00 mg/dL  ? eGFR 71 >59 mL/min/1.73  ? BUN/Creatinine Ratio 18 9 - 23  ? Sodium 140 134 - 144 mmol/L  ? Potassium 4.2 3.5 - 5.2 mmol/L  ? Chloride 102 96 - 106 mmol/L  ? CO2 27 20 - 29 mmol/L  ? Calcium 9.7 8.7 - 10.2 mg/dL  ? Total Protein 6.7 6.0 - 8.5 g/dL  ? Albumin 4.6 3.8 - 4.9 g/dL  ? Globulin, Total 2.1 1.5 - 4.5 g/dL  ? Albumin/Globulin Ratio 2.2 1.2 - 2.2  ? Bilirubin Total 0.8 0.0 - 1.2 mg/dL  ? Alkaline Phosphatase 184 (H) 44 - 121 IU/L  ? AST 49 (H) 0 - 40 IU/L  ? ALT 52 (H) 0 - 32 IU/L  ?TSH  ?Result Value Ref Range  ? TSH 0.524 0.450 - 4.500 uIU/mL  ?Lipid Panel w/o Chol/HDL Ratio  ?Result Value Ref Range  ? Cholesterol, Total 159 100 - 199 mg/dL  ? Triglycerides 74 0 - 149 mg/dL  ? HDL 52 >39 mg/dL  ? VLDL Cholesterol Cal 14 5 - 40 mg/dL  ? LDL Chol Calc (NIH) 93 0 - 99 mg/dL  ?Uric acid  ?Result Value Ref Range  ? Uric Acid 3.6 3.0 - 7.2 mg/dL  ?VITAMIN D 25 Hydroxy (Vit-D Deficiency, Fractures)  ?Result Value Ref Range  ? Vit D, 25-Hydroxy 30.6 30.0 - 100.0 ng/mL  ?Urine Microalbumin w/creat. ratio  ?Result Value Ref Range  ? Creatinine, Urine 188.2 Not Estab. mg/dL  ? Microalbumin, Urine 3.4 Not Estab. ug/mL  ? Microalb/Creat Ratio 2 0 - 29 mg/g creat  ?Gamma GT  ?Result Value Ref Range  ? GGT 57 0 - 60 IU/L  ? ?   ?Assessment & Plan:  ? ?Problem List Items Addressed This Visit   ? ?  ? Endocrine  ? Hypothyroidism - Primary  ?  Chronic, ongoing.  Thyroid labs today.  Continue current medication regimen and adjust as needed based on labs.   ? ?  ?  ? Relevant Orders  ? TSH  ? T4, free  ?  ? Musculoskeletal and Integument  ? Chronic gouty arthropathy without tophi  ?  Chronic, ongoing.  Will continue Colchicine as needed only for flares and increase Allopurinol to 300 MG daily for prevention.  Recommend monitoring diet and low purine diet focus at home.  Uric acid level today. ? ?  ?  ? Relevant Medications  ? allopurinol (ZYLOPRIM) 300 MG tablet  ? Other  Relevant Orders  ? Uric acid  ?  ? ?Follow up plan: ?Return for as scheduled in September. ? ? ? ? ? ?

## 2021-10-09 NOTE — Assessment & Plan Note (Signed)
Chronic, ongoing.  Thyroid labs today.  Continue current medication regimen and adjust as needed based on labs.   ?

## 2021-10-09 NOTE — Assessment & Plan Note (Signed)
Chronic, ongoing.  Will continue Colchicine as needed only for flares and increase Allopurinol to 300 MG daily for prevention.  Recommend monitoring diet and low purine diet focus at home.  Uric acid level today. ?

## 2021-10-09 NOTE — Patient Instructions (Addendum)
Low-Purine Eating Plan A low-purine eating plan involves making food choices to limit your purine intake. Purine is a kind of uric acid. Too much uric acid in your blood can cause certain conditions, such as gout and kidney stones. Eating a low-purine diet may help control these conditions. What are tips for following this plan? Shopping Avoid buying products that contain high-fructose corn syrup. Check for this on food labels. It is commonly found in many processed foods and soft drinks. Be sure to check for it in baked goods such as cookies, canned fruits, and cereals and cereal bars. Avoid buying veal, chicken breast with skin, lamb, and organ meats such as liver. These types of meats tend to have the highest purine content. Choose dairy products. These may lower uric acid levels. Avoid certain types of fish. Not all fish and seafood have high purine content. Examples with high purine content include anchovies, trout, tuna, sardines, and salmon. Avoid buying beverages that contain alcohol, particularly beer and hard liquor. Alcohol can affect the way your body gets rid of uric acid. Meal planning  Learn which foods do or do not affect you. If you find out that a food tends to cause your gout symptoms to flare up, avoid eating that food. You can enjoy foods that do not cause problems. If you have any questions about a food item, talk with your dietitian or health care provider. Reduce the overall amount of meat in your diet. When you do eat meat, choose ones with lower purine content. Include plenty of fruits and vegetables. Although some vegetables may have a high purine content--such as asparagus, mushrooms, spinach, or cauliflower--it has been shown that these do not contribute to uric acid blood levels as much. Consume at least 1 dairy serving a day. This has been shown to decrease uric acid levels. General information If you drink alcohol: Limit how much you have to: 0-1 drink a day for  women who are not pregnant. 0-2 drinks a day for men. Know how much alcohol is in a drink. In the [place] one drink equals one 12 oz bottle of beer (355 mL), one 5 oz glass of wine (148 mL), or one 1 oz glass of hard liquor (44 mL). Drink plenty of water. Try to drink enough to keep your urine pale yellow. Fluids can help remove uric acid from your body. Work with your health care provider and dietitian to develop a plan to achieve or maintain a healthy weight. Losing weight may help reduce uric acid in your blood. What foods are recommended? The following are some types of foods that are good choices when limiting purine intake: Fresh or frozen fruits and vegetables. Whole grains, breads, cereals, and pasta. Rice. Beans, peas, legumes. Nuts and seeds. Dairy products. Fats and oils. The items listed above may not be a complete list. Talk with a dietitian about what dietary choices are best for you. What foods are not recommended? Limit your intake of foods high in purines, including: Beer and other alcohol. Meat-based gravy or sauce. Canned or fresh fish, such as: Anchovies, sardines, herring, salmon, and tuna. Mussels and scallops. Codfish, trout, and haddock. Bacon, veal, chicken breast with skin, and lamb. Organ meats, such as: Liver or kidney. Tripe. Sweetbreads (thymus gland or pancreas). Wild game or goose. Yeast or yeast extract supplements. Drinks sweetened with high-fructose corn syrup, such as soda. Processed foods made with high-fructose corn syrup. The items listed above may not be a complete list of foods   and beverages you should limit. Contact a dietitian for more information. Summary Eating a low-purine diet may help control conditions caused by too much uric acid in the body, such as gout or kidney stones. Choose low-purine foods, limit alcohol, and limit high-fructose corn syrup. You will learn over time which foods do or do not affect you. If you find out that a  food tends to cause your gout symptoms to flare up, avoid eating that food. This information is not intended to replace advice given to you by your health care provider. Make sure you discuss any questions you have with your health care provider. Document Revised: 05/09/2021 Document Reviewed: 05/09/2021 Elsevier Patient Education  2023 Elsevier Inc.  

## 2021-10-10 LAB — T4, FREE: Free T4: 1.75 ng/dL (ref 0.82–1.77)

## 2021-10-10 LAB — URIC ACID: Uric Acid: 3.9 mg/dL (ref 3.0–7.2)

## 2021-10-10 LAB — TSH: TSH: 4.39 u[IU]/mL (ref 0.450–4.500)

## 2021-10-10 NOTE — Progress Notes (Signed)
Contacted via [place] ? ? ?Good afternoon Kristen Larson, your labs have returned.  Thyroid labs are normal this check, continue current Levothyroxine dosing.  Uric acid level stable, continue with Allopurinol as we discussed.  Any questions? ?Keep being incredible!!  Thank you for allowing me to participate in your care.  I appreciate you. ?Kindest regards, ?Shainna Faux ?

## 2021-10-11 ENCOUNTER — Other Ambulatory Visit: Payer: 59

## 2021-12-02 DIAGNOSIS — S76319A Strain of muscle, fascia and tendon of the posterior muscle group at thigh level, unspecified thigh, initial encounter: Secondary | ICD-10-CM | POA: Insufficient documentation

## 2021-12-04 DIAGNOSIS — S86819A Strain of other muscle(s) and tendon(s) at lower leg level, unspecified leg, initial encounter: Secondary | ICD-10-CM | POA: Insufficient documentation

## 2021-12-24 ENCOUNTER — Other Ambulatory Visit: Payer: Self-pay | Admitting: Nurse Practitioner

## 2021-12-25 NOTE — Telephone Encounter (Signed)
last RF 08/23/21 #90 3 RF  Requested Prescriptions  Refused Prescriptions Disp Refills  . colchicine 0.6 MG tablet [Pharmacy Med Name: COLCHICINE  0.6MG   TAB] 92 tablet 3    Sig: TAKE 2 TABS BY MOUTH TODAY. TOMORROW START 1 TAB DAILY  UNTIL GOUT FLARE IMPROVED  IF NO IMPROVEMENT AFTER ONE WEEK RETURN TO OFFICE     Endocrinology:  Gout Agents - colchicine Failed - 12/24/2021 11:42 PM      Failed - ALT in normal range and within 360 days    ALT  Date Value Ref Range Status  08/23/2021 52 (H) 0 - 32 IU/L Final   SGPT (ALT)  Date Value Ref Range Status  05/24/2014 60 U/L Final    Comment:    14-63 NOTE: New Reference Range 12/27/13          Failed - AST in normal range and within 360 days    AST  Date Value Ref Range Status  08/23/2021 49 (H) 0 - 40 IU/L Final   SGOT(AST)  Date Value Ref Range Status  05/24/2014 42 (H) 15 - 37 Unit/L Final         Passed - Cr in normal range and within 360 days    Creatinine  Date Value Ref Range Status  05/25/2014 1.03 0.60 - 1.30 mg/dL Final   Creatinine, Ser  Date Value Ref Range Status  08/23/2021 0.93 0.57 - 1.00 mg/dL Final         Passed - Valid encounter within last 12 months    Recent Outpatient Visits          2 months ago Hypothyroidism, unspecified type   [facility] [place], Dorie Rank, NP   4 months ago Primary hypertension   [facility] [place], [place] T, NP   8 months ago Skin lesions   [facility] [place], [place] T, NP   1 year ago Idiopathic chronic gout of right foot without tophus   [facility] [place], [place] T, NP   1 year ago Idiopathic chronic gout of right foot without tophus   [facility] [place], [place] T, NP      Future Appointments            In 2 months [place], [place] T, NP [employer], [facility]           Passed - CBC within normal limits and completed in the last 12 months    WBC  Date Value Ref Range Status  08/23/2021  5.2 3.4 - 10.8 x10E3/uL Final   RBC  Date Value Ref Range Status  08/23/2021 5.37 (H) 3.77 - 5.28 x10E6/uL Final   Hemoglobin  Date Value Ref Range Status  08/23/2021 15.3 11.1 - 15.9 g/dL Final   Hematocrit  Date Value Ref Range Status  08/23/2021 45.9 34.0 - 46.6 % Final   MCHC  Date Value Ref Range Status  08/23/2021 33.3 31.5 - 35.7 g/dL Final   [facility]  Date Value Ref Range Status  08/23/2021 28.5 26.6 - 33.0 pg Final   MCV  Date Value Ref Range Status  08/23/2021 86 79 - 97 fL Final   No results found for: "PLTCOUNTKUC", "LABPLAT", "POCPLA" RDW  Date Value Ref Range Status  08/23/2021 12.8 11.7 - 15.4 % Final

## 2022-01-07 ENCOUNTER — Telehealth: Payer: Self-pay

## 2022-01-07 MED ORDER — ALLOPURINOL 100 MG PO TABS: 100.0000 mg | ORAL_TABLET | Freq: Every day | ORAL | 6 refills | Status: DC

## 2022-01-07 NOTE — Telephone Encounter (Signed)
Copied from CRM [id number]. Topic: General - Inquiry >> Jan 07, 2022 12:34 PM Teressa P wrote: Reason for CRM: pt called asking if she can go up another 100 mg on her gout medication.  She is still having pain.  CB@ [phone]

## 2022-01-07 NOTE — Telephone Encounter (Signed)
Pt returned call. Pt will increase to 400mg  allopurinol. Pt will rtc if pain continues.

## 2022-01-07 NOTE — Telephone Encounter (Signed)
Left message for patient to give our office a call back to discuss Kristen Larson's recommendations.   OK for [facility]/Nurse Triage to give note if patient calls back.  

## 2022-02-19 ENCOUNTER — Other Ambulatory Visit: Payer: Self-pay | Admitting: Nurse Practitioner

## 2022-02-19 DIAGNOSIS — R928 Other abnormal and inconclusive findings on diagnostic imaging of breast: Secondary | ICD-10-CM

## 2022-02-19 DIAGNOSIS — N63 Unspecified lump in unspecified breast: Secondary | ICD-10-CM

## 2022-02-23 NOTE — Patient Instructions (Signed)
Low-Purine Eating Plan A low-purine eating plan involves making food choices to limit your purine intake. Purine is a kind of uric acid. Too much uric acid in your blood can cause certain conditions, such as gout and kidney stones. Eating a low-purine diet may help control these conditions. What are tips for following this plan? Shopping Avoid buying products that contain high-fructose corn syrup. Check for this on food labels. It is commonly found in many processed foods and soft drinks. Be sure to check for it in baked goods such as cookies, canned fruits, and cereals and cereal bars. Avoid buying veal, chicken breast with skin, lamb, and organ meats such as liver. These types of meats tend to have the highest purine content. Choose dairy products. These may lower uric acid levels. Avoid certain types of fish. Not all fish and seafood have high purine content. Examples with high purine content include anchovies, trout, tuna, sardines, and salmon. Avoid buying beverages that contain alcohol, particularly beer and hard liquor. Alcohol can affect the way your body gets rid of uric acid. Meal planning  Learn which foods do or do not affect you. If you find out that a food tends to cause your gout symptoms to flare up, avoid eating that food. You can enjoy foods that do not cause problems. If you have any questions about a food item, talk with your dietitian or health care provider. Reduce the overall amount of meat in your diet. When you do eat meat, choose ones with lower purine content. Include plenty of fruits and vegetables. Although some vegetables may have a high purine content--such as asparagus, mushrooms, spinach, or cauliflower--it has been shown that these do not contribute to uric acid blood levels as much. Consume at least 1 dairy serving a day. This has been shown to decrease uric acid levels. General information If you drink alcohol: Limit how much you have to: 0-1 drink a day for  women who are not pregnant. 0-2 drinks a day for men. Know how much alcohol is in a drink. In the [place] one drink equals one 12 oz bottle of beer (355 mL), one 5 oz glass of wine (148 mL), or one 1 oz glass of hard liquor (44 mL). Drink plenty of water. Try to drink enough to keep your urine pale yellow. Fluids can help remove uric acid from your body. Work with your health care provider and dietitian to develop a plan to achieve or maintain a healthy weight. Losing weight may help reduce uric acid in your blood. What foods are recommended? The following are some types of foods that are good choices when limiting purine intake: Fresh or frozen fruits and vegetables. Whole grains, breads, cereals, and pasta. Rice. Beans, peas, legumes. Nuts and seeds. Dairy products. Fats and oils. The items listed above may not be a complete list. Talk with a dietitian about what dietary choices are best for you. What foods are not recommended? Limit your intake of foods high in purines, including: Beer and other alcohol. Meat-based gravy or sauce. Canned or fresh fish, such as: Anchovies, sardines, herring, salmon, and tuna. Mussels and scallops. Codfish, trout, and haddock. Bacon, veal, chicken breast with skin, and lamb. Organ meats, such as: Liver or kidney. Tripe. Sweetbreads (thymus gland or pancreas). Wild game or goose. Yeast or yeast extract supplements. Drinks sweetened with high-fructose corn syrup, such as soda. Processed foods made with high-fructose corn syrup. The items listed above may not be a complete list of foods   and beverages you should limit. Contact a dietitian for more information. Summary Eating a low-purine diet may help control conditions caused by too much uric acid in the body, such as gout or kidney stones. Choose low-purine foods, limit alcohol, and limit high-fructose corn syrup. You will learn over time which foods do or do not affect you. If you find out that a  food tends to cause your gout symptoms to flare up, avoid eating that food. This information is not intended to replace advice given to you by your health care provider. Make sure you discuss any questions you have with your health care provider. Document Revised: 05/09/2021 Document Reviewed: 05/09/2021 Elsevier Patient Education  2023 Elsevier Inc.  

## 2022-02-28 ENCOUNTER — Ambulatory Visit: Payer: 59 | Admitting: Nurse Practitioner

## 2022-02-28 ENCOUNTER — Encounter: Payer: Self-pay | Admitting: Nurse Practitioner

## 2022-02-28 VITALS — BP 103/65 | HR 55 | Temp 98.3°F | Wt 155.3 lb

## 2022-02-28 DIAGNOSIS — Z23 Encounter for immunization: Secondary | ICD-10-CM | POA: Diagnosis not present

## 2022-02-28 DIAGNOSIS — M1A00X0 Idiopathic chronic gout, unspecified site, without tophus (tophi): Secondary | ICD-10-CM

## 2022-02-28 DIAGNOSIS — E7800 Pure hypercholesterolemia, unspecified: Secondary | ICD-10-CM

## 2022-02-28 DIAGNOSIS — E039 Hypothyroidism, unspecified: Secondary | ICD-10-CM | POA: Diagnosis not present

## 2022-02-28 DIAGNOSIS — I10 Essential (primary) hypertension: Secondary | ICD-10-CM | POA: Diagnosis not present

## 2022-02-28 NOTE — Assessment & Plan Note (Signed)
Chronic, stable with BP well below goal today.  Will continue Lisinopril 5 MG daily and assess -- patient aware to check BP at home and if consistent elevation >130/80 will return to 10 MG dosing. LABS: BMP.  Recommend she monitor BP daily at home and document + focus on DASH diet.  Return in 6 months for follow-up.

## 2022-02-28 NOTE — Assessment & Plan Note (Signed)
Chronic, ongoing.  Will continue Colchicine as needed only for flares and Allopurinol 400 MG daily for prevention.  Recommend monitoring diet and low purine diet focus at home.  Uric acid level today.

## 2022-02-28 NOTE — Assessment & Plan Note (Signed)
Noted on labs, recheck levels today and discussed at length with patient.  ASCVD 2.5%.  Continue diet focus.

## 2022-02-28 NOTE — Assessment & Plan Note (Signed)
Chronic, ongoing.  Thyroid labs stable and up to date.  Continue current medication regimen and adjust as needed based on labs.

## 2022-02-28 NOTE — Progress Notes (Signed)
BP 103/65   Pulse (!) 55   Temp 98.3 F (36.8 C) (Oral)   Wt 155 lb 4.8 oz (70.4 kg)   LMP  (LMP Unknown)   SpO2 98%   BMI 28.40 kg/m    Subjective:    Patient ID: Kristen Larson, female    DOB: Nov 10, 1960, 61 y.o.   MRN: [id number]  HPI: Kristen Larson is a 61 y.o. female  Chief Complaint  Patient presents with   Hyperlipidemia   Hypertension   Gout   Hypothyroidism   HYPERTENSION / HYPERLIPIDEMIA Continues on Lisinopril and continues focus on diet. Satisfied with current treatment? yes Duration of hypertension: chronic BP monitoring frequency: not checking BP range:  BP medication side effects: no Duration of hyperlipidemia: chronic Aspirin: yes Recent stressors: no Recurrent headaches: no Visual changes: no Palpitations: no Dyspnea: no Chest pain: no Lower extremity edema: no Dizzy/lightheaded: no  The 10-year ASCVD risk score (Arnett DK, et al., 2019) is: 2.5%   Values used to calculate the score:     Age: 4 years     Sex: Female     Is Non-Hispanic African American: No     Diabetic: No     Tobacco smoker: No     Systolic Blood Pressure: 387 mmHg     Is BP treated: Yes     HDL Cholesterol: 52 mg/dL     Total Cholesterol: 159 mg/dL   GOUT Currently on Allopurinol 400 MG daily + Colchicine daily until could her levels under control.  Indocin as needed.  Has not had a gout flare since August 1st -- doing better with increase in Allopurinol to 400 MG. Duration:chronic Right 1st metatarsophalangeal pain: yes Left 1st metatarsophalangeal pain: yes Right knee pain: no Left knee pain: no Swelling: no Redness: no Trauma: no Recent dietary change or indiscretion: no Fevers: no Nausea/vomiting: no Aggravating factors: Alleviating factors:  Status:  stable Treatments attempted:  HYPOTHYROIDISM Taking Levothyroxine 50 MCG.   Thyroid control status: stable Satisfied with current treatment? yes Medication side effects: no Medication compliance:  good compliance Etiology of hypothyroidism:  Recent dose adjustment:no Fatigue: no Cold intolerance: no Heat intolerance: no Weight gain: no Weight loss: no Constipation: no Diarrhea/loose stools: no Palpitations: no Lower extremity edema: no Anxiety/depressed mood: no     02/28/2022    8:37 AM 10/09/2021    9:26 AM 08/23/2021    8:26 AM 09/13/2020    3:34 PM 07/13/2020    8:04 AM  Depression screen PHQ 2/9  Decreased Interest 0 0 0 0 0  Down, Depressed, Hopeless 0 0 0 0 0  PHQ - 2 Score 0 0 0 0 0  Altered sleeping 1 0 1  0  Tired, decreased energy 0 0 0  0  Change in appetite 0 0 0  0  Feeling bad or failure about yourself  0 0 0  0  Trouble concentrating 0 0 0  0  Moving slowly or fidgety/restless 0 0 0  0  Suicidal thoughts 0 0 0  0  PHQ-9 Score 1 0 1  0  Difficult doing work/chores Not difficult at all Not difficult at all          02/28/2022    8:37 AM 10/09/2021    9:27 AM 08/23/2021    8:25 AM 01/26/2018   11:27 AM  GAD 7 : Generalized Anxiety Score  Nervous, Anxious, on Edge 0 0 0 0  Control/stop worrying 0 0 0 0  Worry too much - different things 0 0 0 0  Trouble relaxing 0 0 0 0  Restless 0 0 0 0  Easily annoyed or irritable 0 0 0 0  Afraid - awful might happen 0 0 0 0  Total GAD 7 Score 0 0 0 0  Anxiety Difficulty Not difficult at all Not difficult at all Not difficult at all    Relevant past medical, surgical, family and social history reviewed and updated as indicated. Interim medical history since our last visit reviewed. Allergies and medications reviewed and updated.  Review of Systems  Constitutional:  Negative for activity change, appetite change, diaphoresis, fatigue and fever.  Respiratory:  Negative for cough, chest tightness and shortness of breath.   Cardiovascular:  Negative for chest pain, palpitations and leg swelling.  Gastrointestinal: Negative.   Neurological: Negative.   Psychiatric/Behavioral: Negative.      Per HPI unless specifically  indicated above     Objective:    BP 103/65   Pulse (!) 55   Temp 98.3 F (36.8 C) (Oral)   Wt 155 lb 4.8 oz (70.4 kg)   LMP  (LMP Unknown)   SpO2 98%   BMI 28.40 kg/m   Wt Readings from Last 3 Encounters:  02/28/22 155 lb 4.8 oz (70.4 kg)  10/09/21 151 lb 12.8 oz (68.9 kg)  08/23/21 149 lb 12.8 oz (67.9 kg)    Physical Exam Vitals and nursing note reviewed.  Constitutional:      General: She is awake. She is not in acute distress.    Appearance: She is well-developed and overweight. She is not ill-appearing.  HENT:     Head: Normocephalic.     Right Ear: Hearing normal.     Left Ear: Hearing normal.  Eyes:     General: Lids are normal.        Right eye: No discharge.        Left eye: No discharge.     Conjunctiva/sclera: Conjunctivae normal.     Pupils: Pupils are equal, round, and reactive to light.  Neck:     Vascular: No carotid bruit.  Cardiovascular:     Rate and Rhythm: Normal rate and regular rhythm.     Pulses:          Dorsalis pedis pulses are 2+ on the right side and 2+ on the left side.       Posterior tibial pulses are 2+ on the right side and 2+ on the left side.     Heart sounds: Normal heart sounds. No murmur heard.    No gallop.  Pulmonary:     Effort: Pulmonary effort is normal. No accessory muscle usage or respiratory distress.     Breath sounds: Normal breath sounds.  Abdominal:     General: Bowel sounds are normal.     Palpations: Abdomen is soft.  Musculoskeletal:     Cervical back: Normal range of motion and neck supple.     Right lower leg: No edema.     Left lower leg: No edema.     Right foot: Normal range of motion.     Left foot: Normal range of motion.  Feet:     Right foot:     Skin integrity: No erythema or warmth.     Toenail Condition: Right toenails are normal.     Left foot:     Skin integrity: Skin integrity normal.     Toenail Condition: Left toenails are normal.  Skin:  General: Skin is warm and dry.   Neurological:     Mental Status: She is alert and oriented to person, place, and time.  Psychiatric:        Attention and Perception: Attention normal.        Mood and Affect: Mood normal.        Speech: Speech normal.        Behavior: Behavior normal. Behavior is cooperative.     Results for orders placed or performed in visit on 10/09/21  TSH  Result Value Ref Range   TSH 4.390 0.450 - 4.500 uIU/mL  T4, free  Result Value Ref Range   Free T4 1.75 0.82 - 1.77 ng/dL  Uric acid  Result Value Ref Range   Uric Acid 3.9 3.0 - 7.2 mg/dL      Assessment & Plan:   Problem List Items Addressed This Visit       Cardiovascular and Mediastinum   Hypertension - Primary    Chronic, stable with BP well below goal today.  Will continue Lisinopril 5 MG daily and assess -- patient aware to check BP at home and if consistent elevation >130/80 will return to 10 MG dosing. LABS: BMP.  Recommend she monitor BP daily at home and document + focus on DASH diet.  Return in 6 months for follow-up.      Relevant Orders   Basic metabolic panel     Endocrine   Hypothyroidism    Chronic, ongoing.  Thyroid labs stable and up to date.  Continue current medication regimen and adjust as needed based on labs.          Musculoskeletal and Integument   Chronic gouty arthropathy without tophi    Chronic, ongoing.  Will continue Colchicine as needed only for flares and Allopurinol 400 MG daily for prevention.  Recommend monitoring diet and low purine diet focus at home.  Uric acid level today.      Relevant Orders   Uric acid     Other   Elevated low density lipoprotein (LDL) cholesterol level    Noted on labs, recheck levels today and discussed at length with patient.  ASCVD 2.5%.  Continue diet focus.      Relevant Orders   Lipid Panel w/o Chol/HDL Ratio   Other Visit Diagnoses     Flu vaccine need       Flu vaccine today   Relevant Orders   Flu Vaccine QUAD 6+ mos PF IM (Fluarix Quad PF)  (Completed)        Follow up plan: Return in about 6 months (around 08/29/2022) for Annual Physical.

## 2022-03-01 LAB — BASIC METABOLIC PANEL
BUN/Creatinine Ratio: 18 (ref 12–28)
BUN: 16 mg/dL (ref 8–27)
CO2: 23 mmol/L (ref 20–29)
Calcium: 9.4 mg/dL (ref 8.7–10.3)
Chloride: 101 mmol/L (ref 96–106)
Creatinine, Ser: 0.89 mg/dL (ref 0.57–1.00)
Glucose: 79 mg/dL (ref 70–99)
Potassium: 4.2 mmol/L (ref 3.5–5.2)
Sodium: 142 mmol/L (ref 134–144)
eGFR: 74 mL/min/{1.73_m2} (ref >59)

## 2022-03-01 LAB — URIC ACID: Uric Acid: 2.4 mg/dL — ABNORMAL LOW (ref 3.0–7.2)

## 2022-03-01 LAB — LIPID PANEL W/O CHOL/HDL RATIO
Cholesterol, Total: 177 mg/dL (ref 100–199)
HDL: 52 mg/dL (ref >39)
LDL Chol Calc (NIH): 106 mg/dL — ABNORMAL HIGH (ref 0–99)
Triglycerides: 105 mg/dL (ref 0–149)
VLDL Cholesterol Cal: 19 mg/dL (ref 5–40)

## 2022-03-01 NOTE — Progress Notes (Signed)
Contacted via [facility] -- however do not believe she checks, so please call: Good afternoon Kristen Larson, your labs have returned: - Cholesterol labs show mild elevation in LDL, bad cholesterol, do not need to start medication at this time.  Continue diet and exercise focus.   - Kidney function, creatinine and eGFR, remains normal. - Uric acid is on low side, which is good, should not have any flares with this.  Any questions? Keep being amazing!!  Thank you for allowing me to participate in your care.  I appreciate you. Kindest regards, Zaviyar Rahal

## 2022-03-05 ENCOUNTER — Other Ambulatory Visit: Payer: Self-pay | Admitting: Nurse Practitioner

## 2022-03-05 NOTE — Telephone Encounter (Signed)
Requested Prescriptions  Pending Prescriptions Disp Refills  . fluticasone (FLONASE) 50 MCG/ACT nasal spray [Pharmacy Med Name: FLUTICASONE PROP 50 MCG SPRAY] 16 mL 0    Sig: SPRAY 2 SPRAYS INTO EACH NOSTRIL EVERY DAY     Ear, Nose, and Throat: Nasal Preparations - Corticosteroids Passed - 03/05/2022  2:32 PM      Passed - Valid encounter within last 12 months    Recent Outpatient Visits          5 days ago Primary hypertension   [place], [place] T, NP   4 months ago Hypothyroidism, unspecified type   [facility] [place], Barbaraann Faster, NP   6 months ago Primary hypertension   [place], [place] T, NP   11 months ago Skin lesions   [place], [place] T, NP   1 year ago Idiopathic chronic gout of right foot without tophus   [place], Barbaraann Faster, NP      Future Appointments            In 6 months Cannady, Barbaraann Faster, NP [facility], [facility]

## 2022-03-13 ENCOUNTER — Other Ambulatory Visit: Payer: Self-pay | Admitting: Nurse Practitioner

## 2022-03-14 NOTE — Telephone Encounter (Signed)
Unable to refill per protocol, rx request is too soon. Last refill 08/23/21 for 90 and 3 RF.E-Prescribing Status: Receipt confirmed by pharmacy (08/23/2021 8:49 AM EDT). Should have enough until [place]. Will refuse.   Requested Prescriptions  Pending Prescriptions Disp Refills  . colchicine 0.6 MG tablet [Pharmacy Med Name: COLCHICINE  0.6MG   TAB] 84 tablet 3    Sig: TAKE 2 TABS BY MOUTH TODAY. TOMORROW START 1 TAB DAILY  UNTIL GOUT FLARE IMPROVED  IF NO IMPROVEMENT AFTER ONE WEEK RETURN TO OFFICE     Endocrinology:  Gout Agents - colchicine Failed - 03/13/2022 10:25 PM      Failed - ALT in normal range and within 360 days    ALT  Date Value Ref Range Status  08/23/2021 52 (H) 0 - 32 IU/L Final   SGPT (ALT)  Date Value Ref Range Status  05/24/2014 60 U/L Final    Comment:    14-63 NOTE: New Reference Range 12/27/13          Failed - AST in normal range and within 360 days    AST  Date Value Ref Range Status  08/23/2021 49 (H) 0 - 40 IU/L Final   SGOT(AST)  Date Value Ref Range Status  05/24/2014 42 (H) 15 - 37 Unit/L Final         Passed - Cr in normal range and within 360 days    Creatinine  Date Value Ref Range Status  05/25/2014 1.03 0.60 - 1.30 mg/dL Final   Creatinine, Ser  Date Value Ref Range Status  02/28/2022 0.89 0.57 - 1.00 mg/dL Final         Passed - Valid encounter within last 12 months    Recent Outpatient Visits          2 weeks ago Primary hypertension   [place], [place] T, NP   5 months ago Hypothyroidism, unspecified type   [employer], Barbaraann Faster, NP   6 months ago Primary hypertension   [place], [place] T, NP   11 months ago Skin lesions   [place], [place] T, NP   1 year ago Idiopathic chronic gout of right foot without tophus   [place], [place] T, NP      Future Appointments            In 6 months Cannady, [place] T, NP  [facility], [facility]           Passed - CBC within normal limits and completed in the last 12 months    WBC  Date Value Ref Range Status  08/23/2021 5.2 3.4 - 10.8 x10E3/uL Final   RBC  Date Value Ref Range Status  08/23/2021 5.37 (H) 3.77 - 5.28 x10E6/uL Final   Hemoglobin  Date Value Ref Range Status  08/23/2021 15.3 11.1 - 15.9 g/dL Final   Hematocrit  Date Value Ref Range Status  08/23/2021 45.9 34.0 - 46.6 % Final   MCHC  Date Value Ref Range Status  08/23/2021 33.3 31.5 - 35.7 g/dL Final   [facility]  Date Value Ref Range Status  08/23/2021 28.5 26.6 - 33.0 pg Final   MCV  Date Value Ref Range Status  08/23/2021 86 79 - 97 fL Final   No results found for: "PLTCOUNTKUC", "LABPLAT", "POCPLA" RDW  Date Value Ref Range Status  08/23/2021 12.8 11.7 - 15.4 % Final

## 2022-03-28 ENCOUNTER — Other Ambulatory Visit: Payer: Self-pay | Admitting: Nurse Practitioner

## 2022-03-28 NOTE — Telephone Encounter (Signed)
  Notes to clinic:  Unclear if provider wants pt to take year round, please assess pharm request. Pharmacy comment: REQUEST FOR 90 DAYS PRESCRIPTION.      Requested Prescriptions  Pending Prescriptions Disp Refills   fluticasone (FLONASE) 50 MCG/ACT nasal spray [Pharmacy Med Name: FLUTICASONE PROP 50 MCG SPRAY] 48 mL 1    Sig: SPRAY 2 SPRAYS INTO EACH NOSTRIL EVERY DAY     Ear, Nose, and Throat: Nasal Preparations - Corticosteroids Passed - 03/28/2022  2:31 PM      Passed - Valid encounter within last 12 months    Recent Outpatient Visits           4 weeks ago Primary hypertension   [place], [place] T, NP   5 months ago Hypothyroidism, unspecified type   [facility] [place], Barbaraann Faster, NP   7 months ago Primary hypertension   [place], [place] T, NP   12 months ago Skin lesions   [place], [place] T, NP   1 year ago Idiopathic chronic gout of right foot without tophus   [place], Barbaraann Faster, NP       Future Appointments             In 5 months Cannady, Barbaraann Faster, NP [facility], [facility]

## 2022-04-09 ENCOUNTER — Ambulatory Visit
Admission: RE | Admit: 2022-04-09 | Discharge: 2022-04-09 | Disposition: A | Discharge status: Home / Self Care | Payer: 59 | Source: Ambulatory Visit | Attending: Nurse Practitioner | Admitting: Nurse Practitioner

## 2022-04-09 DIAGNOSIS — R928 Other abnormal and inconclusive findings on diagnostic imaging of breast: Secondary | ICD-10-CM | POA: Diagnosis present

## 2022-04-09 DIAGNOSIS — N63 Unspecified lump in unspecified breast: Secondary | ICD-10-CM

## 2022-08-04 ENCOUNTER — Other Ambulatory Visit: Payer: Self-pay | Admitting: Nurse Practitioner

## 2022-08-05 NOTE — Telephone Encounter (Signed)
Requested Prescriptions  Pending Prescriptions Disp Refills   lisinopril (ZESTRIL) 5 MG tablet [Pharmacy Med Name: LISINOPRIL 5 MG TABLET] 30 tablet 14    Sig: TAKE 1 TABLET (5 MG TOTAL) BY MOUTH DAILY.     Cardiovascular:  ACE Inhibitors Passed - 08/04/2022  1:39 AM      Passed - Cr in normal range and within 180 days    Creatinine  Date Value Ref Range Status  05/25/2014 1.03 0.60 - 1.30 mg/dL Final   Creatinine, Ser  Date Value Ref Range Status  02/28/2022 0.89 0.57 - 1.00 mg/dL Final         Passed - K in normal range and within 180 days    Potassium  Date Value Ref Range Status  02/28/2022 4.2 3.5 - 5.2 mmol/L Final  05/24/2014 3.9 3.5 - 5.1 mmol/L Final         Passed - Patient is not pregnant      Passed - Last BP in normal range    BP Readings from Last 1 Encounters:  02/28/22 103/65         Passed - Valid encounter within last 6 months    Recent Outpatient Visits           5 months ago Primary hypertension   [place], Henrine Screws T, NP   10 months ago Hypothyroidism, unspecified type   [place], Barbaraann Faster, NP   11 months ago Primary hypertension   [place], [place] T, NP   1 year ago Skin lesions   [place], [place] T, NP   1 year ago Idiopathic chronic gout of right foot without tophus   [place], Barbaraann Faster, NP       Future Appointments             In 1 month Cannady, Barbaraann Faster, NP [place]

## 2022-08-13 ENCOUNTER — Encounter: Payer: Self-pay | Admitting: Nurse Practitioner

## 2022-08-29 ENCOUNTER — Other Ambulatory Visit: Payer: Self-pay | Admitting: Nurse Practitioner

## 2022-09-01 NOTE — Telephone Encounter (Signed)
Future OV scheduled 09/17/22.  Requested Prescriptions  Pending Prescriptions Disp Refills   lisinopril (ZESTRIL) 5 MG tablet [Pharmacy Med Name: LISINOPRIL 5 MG TABLET] 90 tablet 0    Sig: TAKE 1 TABLET (5 MG TOTAL) BY MOUTH DAILY.     Cardiovascular:  ACE Inhibitors Failed - 08/29/2022  1:33 PM      Failed - Cr in normal range and within 180 days    Creatinine  Date Value Ref Range Status  05/25/2014 1.03 0.60 - 1.30 mg/dL Final   Creatinine, Ser  Date Value Ref Range Status  02/28/2022 0.89 0.57 - 1.00 mg/dL Final         Failed - K in normal range and within 180 days    Potassium  Date Value Ref Range Status  02/28/2022 4.2 3.5 - 5.2 mmol/L Final  05/24/2014 3.9 3.5 - 5.1 mmol/L Final         Failed - Valid encounter within last 6 months    Recent Outpatient Visits           6 months ago Primary hypertension   [place], Henrine Screws T, NP   10 months ago Hypothyroidism, unspecified type   [place], Henrine Screws T, NP   1 year ago Primary hypertension   [place], Henrine Screws T, NP   1 year ago Skin lesions   [place], [place] T, NP   1 year ago Idiopathic chronic gout of right foot without tophus   [place], Barbaraann Faster, NP       Future Appointments             In 2 weeks Venita [place], NP [place], [place] - Patient is not pregnant      Passed - Last BP in normal range    BP Readings from Last 1 Encounters:  02/28/22 103/65

## 2022-09-02 ENCOUNTER — Other Ambulatory Visit: Payer: Self-pay | Admitting: Nurse Practitioner

## 2022-09-02 NOTE — Telephone Encounter (Signed)
Unable to refill per protocol, Rx request is too soon. Last refill 08/24/21 for 30 and 12 refills.  Requested Prescriptions  Pending Prescriptions Disp Refills   levothyroxine (SYNTHROID) 50 MCG tablet [Pharmacy Med Name: LEVOTHYROXINE 50 MCG TABLET] 30 tablet 12    Sig: TAKE 1 TABLET BY MOUTH EVERY DAY     Endocrinology:  Hypothyroid Agents Passed - 09/02/2022  2:26 AM      Passed - TSH in normal range and within 360 days    TSH  Date Value Ref Range Status  10/09/2021 4.390 0.450 - 4.500 uIU/mL Final         Passed - Valid encounter within last 12 months    Recent Outpatient Visits           6 months ago Primary hypertension   [place], [place] T, NP   10 months ago Hypothyroidism, unspecified type   [place], Henrine Screws T, NP   1 year ago Primary hypertension   [place], Henrine Screws T, NP   1 year ago Skin lesions   [place], Henrine Screws T, NP   1 year ago Idiopathic chronic gout of right foot without tophus   [place], Barbaraann Faster, NP       Future Appointments             In 2 weeks Cannady, Barbaraann Faster, NP [place]

## 2022-09-05 ENCOUNTER — Other Ambulatory Visit: Payer: Self-pay | Admitting: Nurse Practitioner

## 2022-09-05 NOTE — Telephone Encounter (Signed)
Requested Prescriptions  Pending Prescriptions Disp Refills   levothyroxine (SYNTHROID) 50 MCG tablet [Pharmacy Med Name: LEVOTHYROXINE 50 MCG TABLET] 90 tablet 0    Sig: TAKE 1 TABLET BY MOUTH EVERY DAY     Endocrinology:  Hypothyroid Agents Passed - 09/05/2022  4:22 PM      Passed - TSH in normal range and within 360 days    TSH  Date Value Ref Range Status  10/09/2021 4.390 0.450 - 4.500 uIU/mL Final         Passed - Valid encounter within last 12 months    Recent Outpatient Visits           6 months ago Primary hypertension   [place], [place] T, NP   11 months ago Hypothyroidism, unspecified type   [place], Henrine Screws T, NP   1 year ago Primary hypertension   [place], Henrine Screws T, NP   1 year ago Skin lesions   [place], Henrine Screws T, NP   1 year ago Idiopathic chronic gout of right foot without tophus   [place], Barbaraann Faster, NP       Future Appointments             In 1 week Cannady, Barbaraann Faster, NP [place]

## 2022-09-12 ENCOUNTER — Encounter: Payer: 59 | Admitting: Nurse Practitioner

## 2022-09-17 ENCOUNTER — Encounter: Payer: 59 | Admitting: Nurse Practitioner

## 2022-09-25 ENCOUNTER — Other Ambulatory Visit: Payer: Self-pay | Admitting: Nurse Practitioner

## 2022-09-25 NOTE — Telephone Encounter (Signed)
Requested Prescriptions  Pending Prescriptions Disp Refills   fluticasone (FLONASE) 50 MCG/ACT nasal spray [Pharmacy Med Name: FLUTICASONE PROP 50 MCG SPRAY] 48 mL 0    Sig: SPRAY 2 SPRAYS INTO EACH NOSTRIL EVERY DAY     Ear, Nose, and Throat: Nasal Preparations - Corticosteroids Passed - 09/25/2022  1:35 AM      Passed - Valid encounter within last 12 months    Recent Outpatient Visits           6 months ago Primary hypertension   [facility] [place], [place] T, NP   11 months ago Hypothyroidism, unspecified type   [place] [facility] [place], Corrie Dandy T, NP   1 year ago Primary hypertension   [place] [facility] [place], Corrie Dandy T, NP   1 year ago Skin lesions   [facility] [place], Corrie Dandy T, NP   1 year ago Idiopathic chronic gout of right foot without tophus   [place] [facility] [place], Dorie Rank, NP       Future Appointments             In 1 month [place], Dorie Rank, NP [place] [facility]

## 2022-10-26 NOTE — Patient Instructions (Signed)
Be Involved in Your Health Care:  Taking Medications When medications are taken as directed, they can greatly improve your health. But if they are not taken as instructed, they may not work. In some cases, not taking them correctly can be harmful. To help ensure your treatment remains effective and safe, understand your medications and how to take them.  Your lab results, notes and after visit summary will be available on My Chart. We strongly encourage you to use this feature. If lab results are abnormal the clinic will contact you with the appropriate steps. If the clinic does not contact you assume the results are satisfactory. You can always see your results on My Chart. If you have questions regarding your condition, please contact the clinic during office hours. You can also ask questions on My Chart.  We at [facility] are grateful that you chose [place] to provide care. We strive to provide excellent and compassionate care and are always looking for feedback. If you get a survey from the clinic please complete this.   Gout  Gout is painful swelling of your joints. Gout is a type of arthritis. It is caused by having too much uric acid in your body. Uric acid is a chemical that is made when your body breaks down substances called purines. If your body has too much uric acid, sharp crystals can form and build up in your joints. This causes pain and swelling. Gout attacks can happen quickly and be very painful (acute gout). Over time, the attacks can affect more joints and happen more often (chronic gout). What are the causes? Gout is caused by too much uric acid in your blood. This can happen because: Your kidneys do not remove enough uric acid from your blood. Your body makes too much uric acid. You eat too many foods that are high in purines. These foods include organ meats, some seafood, and beer. Trauma or stress can bring on an attack. What increases the risk? Having a family  history of gout. Being female and middle-aged. Being female and having gone through menopause. Having an organ transplant. Taking certain medicines. Having certain conditions, such as: Being very overweight (obese). Lead poisoning. Kidney disease. A skin condition called psoriasis. Other risks include: Losing weight too quickly. Not having enough water in the body (being dehydrated). Drinking alcohol, especially beer. Drinking beverages that are sweetened with a type of sugar called fructose. What are the signs or symptoms? An attack of acute gout often starts at night and usually happens in just one joint. The most common place is the big toe. Other joints that may be affected include joints of the feet, ankle, knee, fingers, wrist, or elbow. Symptoms may include: Very bad pain. Warmth. Swelling. Stiffness. Tenderness. The affected joint may be very painful to touch. Shiny, red, or purple skin. Chills and fever. Chronic gout may cause symptoms more often. More joints may be involved. You may also have white or yellow lumps (tophi) on your hands or feet or in other areas near your joints. How is this treated? Treatment for an acute attack may include medicines for pain and swelling, such as: NSAIDs, such as ibuprofen. Steroids taken by mouth or injected into a joint. Colchicine. This can be given by mouth or through an IV tube. Treatment to prevent future attacks may include: Taking small doses of NSAIDs or colchicine daily. Using a medicine that reduces uric acid levels in your blood, such as allopurinol. Making changes to your diet.  You may need to see a food expert (dietitian) about what to eat and drink to prevent gout. Follow these instructions at home: During a gout attack  If told, put ice on the painful area. To do this: Put ice in a plastic bag. Place a towel between your skin and the bag. Leave the ice on for 20 minutes, 2-3 times a day. Take off the ice if your skin  turns bright red. This is very important. If you cannot feel pain, heat, or cold, you have a greater risk of damage to the area. Raise the painful joint above the level of your heart as often as you can. Rest the joint as much as possible. If the joint is in your leg, you may be given crutches. Follow instructions from your doctor about what you cannot eat or drink. Avoiding future gout attacks Eat a low-purine diet. Avoid foods and drinks such as: Liver. Kidney. Anchovies. Asparagus. Herring. Mushrooms. Mussels. Beer. Stay at a healthy weight. If you want to lose weight, talk with your doctor. Do not lose weight too fast. Start or continue an exercise plan as told by your doctor. Eating and drinking Avoid drinks sweetened by fructose. Drink enough fluids to keep your pee (urine) pale yellow. If you drink alcohol: Limit how much you have to: 0-1 drink a day for women who are not pregnant. 0-2 drinks a day for men. Know how much alcohol is in a drink. In the [place] one drink equals one 12 oz bottle of beer (355 mL), one 5 oz glass of wine (148 mL), or one 1 oz glass of hard liquor (44 mL). General instructions Take over-the-counter and prescription medicines only as told by your doctor. Ask your doctor if you should avoid driving or using machines while you are taking your medicine. Return to your normal activities when your doctor says that it is safe. Keep all follow-up visits. Where to find more information [employer]: www.niams.http://www.myers.net/ Contact a doctor if: You have another gout attack. You still have symptoms of a gout attack after 10 days of treatment. You have problems (side effects) because of your medicines. You have chills or a fever. You have burning pain when you pee (urinate). You have pain in your lower back or belly. Get help right away if: You have very bad pain. Your pain cannot be controlled. You cannot pee. Summary Gout is painful  swelling of the joints. The most common site of pain is the big toe, but it can affect other joints. Medicines and avoiding some foods can help to prevent and treat gout attacks. This information is not intended to replace advice given to you by your health care provider. Make sure you discuss any questions you have with your health care provider. Document Revised: 02/27/2021 Document Reviewed: 02/27/2021 Elsevier Patient Education  2023 [employer].

## 2022-10-31 ENCOUNTER — Ambulatory Visit (INDEPENDENT_AMBULATORY_CARE_PROVIDER_SITE_OTHER): Payer: 59 | Admitting: Nurse Practitioner

## 2022-10-31 ENCOUNTER — Encounter: Payer: Self-pay | Admitting: Nurse Practitioner

## 2022-10-31 VITALS — BP 135/75 | HR 64 | Temp 98.0°F | Ht 62.01 in | Wt 158.6 lb

## 2022-10-31 DIAGNOSIS — E039 Hypothyroidism, unspecified: Secondary | ICD-10-CM | POA: Diagnosis not present

## 2022-10-31 DIAGNOSIS — M1A00X0 Idiopathic chronic gout, unspecified site, without tophus (tophi): Secondary | ICD-10-CM | POA: Diagnosis not present

## 2022-10-31 DIAGNOSIS — E7800 Pure hypercholesterolemia, unspecified: Secondary | ICD-10-CM | POA: Diagnosis not present

## 2022-10-31 DIAGNOSIS — I10 Essential (primary) hypertension: Secondary | ICD-10-CM

## 2022-10-31 DIAGNOSIS — R748 Abnormal levels of other serum enzymes: Secondary | ICD-10-CM

## 2022-10-31 DIAGNOSIS — Z6829 Body mass index (BMI) 29.0-29.9, adult: Secondary | ICD-10-CM

## 2022-10-31 DIAGNOSIS — K219 Gastro-esophageal reflux disease without esophagitis: Secondary | ICD-10-CM

## 2022-10-31 DIAGNOSIS — J301 Allergic rhinitis due to pollen: Secondary | ICD-10-CM

## 2022-10-31 DIAGNOSIS — Z0000 Encounter for general adult medical examination without abnormal findings: Secondary | ICD-10-CM

## 2022-10-31 LAB — MICROALBUMIN, URINE WAIVED
Creatinine, Urine Waived: 100 mg/dL (ref 10–300)
Microalb, Ur Waived: 10 mg/L (ref 0–19)
Microalb/Creat Ratio: <30 mg/g (ref <30)

## 2022-10-31 MED ORDER — ALLOPURINOL 300 MG PO TABS: 300.0000 mg | ORAL_TABLET | Freq: Every day | ORAL | 4 refills | Status: DC

## 2022-10-31 MED ORDER — FLUTICASONE PROPIONATE 50 MCG/ACT NA SUSP: NASAL | 4 refills | Status: DC

## 2022-10-31 MED ORDER — ALLOPURINOL 100 MG PO TABS: 100.0000 mg | ORAL_TABLET | Freq: Every day | ORAL | 4 refills | Status: DC

## 2022-10-31 MED ORDER — LEVOTHYROXINE SODIUM 50 MCG PO TABS: 50.0000 ug | ORAL_TABLET | Freq: Every day | ORAL | 4 refills | Status: DC

## 2022-10-31 NOTE — Assessment & Plan Note (Signed)
Chronic, ongoing.  Thyroid labs obtained today.  Continue current medication regimen and adjust as needed based on labs.

## 2022-10-31 NOTE — Assessment & Plan Note (Signed)
Chronic, ongoing.  Stable with Flonase, continue this regimen.  Refills sent. ?

## 2022-10-31 NOTE — Assessment & Plan Note (Signed)
Chronic, ongoing.  Will continue Colchicine as needed only for flares and Allopurinol 400 MG daily for prevention.  Recommend monitoring diet and low purine diet focus at home.  Uric acid level today. 

## 2022-10-31 NOTE — Assessment & Plan Note (Addendum)
Recheck alk phos and GGT today.  Consider u/s in future if continued elevations or symptoms present.  Does not have a gall bladder.

## 2022-10-31 NOTE — Assessment & Plan Note (Signed)
Chronic, stable.  Recommend continued use of Pepcid as needed and keeping food journal, avoid trigger foods.  Labs today.

## 2022-10-31 NOTE — Assessment & Plan Note (Signed)
Noted on labs, recheck levels today and discussed at length with patient.  ASCVD 4.6%.  Continue diet focus.

## 2022-10-31 NOTE — Assessment & Plan Note (Signed)
BMI 29.00.  Recommended eating smaller high protein, low fat meals more frequently and exercising 30 mins a day 5 times a week with a goal of 10-15lb weight loss in the next 3 months. Patient voiced their understanding and motivation to adhere to these recommendations.

## 2022-10-31 NOTE — Progress Notes (Signed)
BP 135/75   Pulse 64   Temp 98 F (36.7 C) (Oral)   Ht 5' 2.01" (1.575 m)   Wt 158 lb 9.6 oz (71.9 kg)   LMP  (LMP Unknown)   SpO2 99%   BMI 29.00 kg/m    Subjective:    Patient ID: Kristen Larson, female    DOB: May 29, 1961, 62 y.o.   MRN: [id number]  HPI: Kristen Larson is a 62 y.o. female presenting on 10/31/2022 for comprehensive medical examination. Current medical complaints include: none  She currently lives with: self Menopausal Symptoms: no   Continues Flonase for allergies, which offers benefit.  HYPERTENSION  Continues on Lisinopril.  Satisfied with current treatment? yes Duration of hypertension: chronic BP monitoring frequency: not checking BP range:  BP medication side effects: no Aspirin: yes Recent stressors: no Recurrent headaches: no Visual changes: no Palpitations: no Dyspnea: no Chest pain: no Lower extremity edema: no Dizzy/lightheaded: no  The 10-year ASCVD risk score (Arnett DK, et al., 2019) is: 4.6%   Values used to calculate the score:     Age: 71 years     Sex: Female     Is Non-Hispanic African American: No     Diabetic: No     Tobacco smoker: No     Systolic Blood Pressure: 135 mmHg     Is BP treated: Yes     HDL Cholesterol: 52 mg/dL     Total Cholesterol: 177 mg/dL   HYPOTHYROIDISM Continues on Levothyroxine 50 MCG. Thyroid control status:stable Satisfied with current treatment? yes Medication side effects: no Medication compliance: good compliance Etiology of hypothyroidism:  Recent dose adjustment:no Fatigue: no Cold intolerance: no Heat intolerance: no Weight gain: no Weight loss: no Constipation: no Diarrhea/loose stools: no Palpitations: no Lower extremity edema: no Anxiety/depressed mood: no   GERD Has history of elevation in Alk Phos being monitored.  Had gall bladder removed years ago. No recent issues with this, takes rare Pepcid. GERD control status: stable Satisfied with current treatment?  no Heartburn frequency: once or twice a week Medication side effects: no  Previous GERD medications: none Antacid use frequency:  none Nature: reflux Location: epigastric Heartburn duration: years Alleviatiating factors:  nothing Aggravating factors: certain foods Dysphagia: no Odynophagia:  no Hematemesis: no Blood in stool: no EGD: no   GOUT Taking Allopurinol 400 MG daily.  No recent gout flares, none since March 2023. Did see rheumatology in August 2022, to return as needed only. Duration: chronic Swelling: no Redness: no Trauma: no Recent dietary change or indiscretion: no Fevers: no Nausea/vomiting: no Aggravating factors: foods Alleviating factors: Allopurinol Status:  stable Treatments attempted: as above  Depression Screen done today and results listed below:     10/31/2022    9:38 AM 02/28/2022    8:37 AM 10/09/2021    9:26 AM 08/23/2021    8:26 AM 09/13/2020    3:34 PM  Depression screen PHQ 2/9  Decreased Interest 0 0 0 0 0  Down, Depressed, Hopeless 0 0 0 0 0  PHQ - 2 Score 0 0 0 0 0  Altered sleeping 0 1 0 1   Tired, decreased energy 0 0 0 0   Change in appetite 0 0 0 0   Feeling bad or failure about yourself  0 0 0 0   Trouble concentrating 0 0 0 0   Moving slowly or fidgety/restless 0 0 0 0   Suicidal thoughts 0 0 0 0   PHQ-9 Score 0  1 0 1   Difficult doing work/chores Not difficult at all Not difficult at all Not difficult at all        10/31/2022    9:38 AM 02/28/2022    8:37 AM 10/09/2021    9:27 AM 08/23/2021    8:25 AM  GAD 7 : Generalized Anxiety Score  Nervous, Anxious, on Edge 0 0 0 0  Control/stop worrying 0 0 0 0  Worry too much - different things 0 0 0 0  Trouble relaxing 0 0 0 0  Restless 0 0 0 0  Easily annoyed or irritable 0 0 0 0  Afraid - awful might happen 0 0 0 0  Total GAD 7 Score 0 0 0 0  Anxiety Difficulty Not difficult at all Not difficult at all Not difficult at all Not difficult at all      09/13/2020    3:34 PM  08/23/2021    8:26 AM 08/23/2021    8:37 AM 10/09/2021    9:24 AM 10/31/2022    9:36 AM  Fall Risk  Falls in the past year? 0 0 0 0 0  Was there an injury with Fall?  0 0 0 0  Fall Risk Category Calculator  0 0 0 0  Fall Risk Category (Retired)  Low Low Low   (RETIRED) Patient Fall Risk Level  Low fall risk Low fall risk Low fall risk   Patient at Risk for Falls Due to  No Fall Risks No Fall Risks No Fall Risks   Fall risk Follow up  Falls evaluation completed Education provided Falls evaluation completed     Functional Status Survey: Is the patient deaf or have difficulty hearing?: No Does the patient have difficulty seeing, even when wearing glasses/contacts?: No Does the patient have difficulty concentrating, remembering, or making decisions?: No Does the patient have difficulty walking or climbing stairs?: No Does the patient have difficulty dressing or bathing?: No Does the patient have difficulty doing errands alone such as visiting a doctor's office or shopping?: No    Past Medical History:  Past Medical History:  Diagnosis Date   Allergy    GERD (gastroesophageal reflux disease)    Gout    Hypertension    Thyroid disease     Surgical History:  Past Surgical History:  Procedure Laterality Date   CHOLECYSTECTOMY  05/25/14   TONSILLECTOMY AND ADENOIDECTOMY      Medications:  Current Outpatient Medications on File Prior to Visit  Medication Sig   aspirin 81 MG tablet Take 81 mg by mouth daily.   colchicine 0.6 MG tablet TAKE 2 TABS TODAY. THEN  TOMORROW START 1 TAB DAILY  UNTIL GOUT FLARE IMPROVED  IF NO IMPROVEMENT AFTER ONE WEEK RETURN TO OFFICE   indomethacin (INDOCIN) 50 MG capsule Take 50 mg by mouth 2 (two) times daily with a meal.   lisinopril (ZESTRIL) 5 MG tablet TAKE 1 TABLET (5 MG TOTAL) BY MOUTH DAILY.   Multiple Vitamin (MULTIVITAMIN) tablet Take 1 tablet by mouth daily.   triamcinolone cream (KENALOG) 0.1 % Apply 1 application. topically 2 (two) times  daily.   No current facility-administered medications on file prior to visit.    Allergies:  Allergies  Allergen Reactions   Penicillins Rash    Social History:  Social History   Socioeconomic History   Marital status: Married    Spouse name: Not on file   Number of children: Not on file   Years of education: Not on  file   Highest education level: Not on file  Occupational History   Not on file  Tobacco Use   Smoking status: Never   Smokeless tobacco: Never  Vaping Use   Vaping Use: Never used  Substance and Sexual Activity   Alcohol use: No    Alcohol/week: 0.0 standard drinks of alcohol   Drug use: No   Sexual activity: Yes  Other Topics Concern   Not on file  Social History Narrative   Not on file   Social Determinants of Health   Financial Resource Strain: Low Risk  (08/23/2021)   Overall Financial Resource Strain (CARDIA)    Difficulty of Paying Living Expenses: Not hard at all  Food Insecurity: No Food Insecurity (08/23/2021)   Hunger Vital Sign    Worried About Running Out of Food in the Last Year: Never true    Ran Out of Food in the Last Year: Never true  Transportation Needs: No Transportation Needs (08/23/2021)   PRAPARE - Administrator, Civil Service (Medical): No    Lack of Transportation (Non-Medical): No  Physical Activity: Inactive (08/23/2021)   Exercise Vital Sign    Days of Exercise per Week: 0 days    Minutes of Exercise per Session: 0 min  Stress: No Stress Concern Present (08/23/2021)   [employer] of Occupational Health - Occupational Stress Questionnaire    Feeling of Stress : Only a little  Social Connections: Moderately Integrated (08/23/2021)   Social Connection and Isolation Panel [NHANES]    Frequency of Communication with Friends and Family: Twice a week    Frequency of Social Gatherings with Friends and Family: Twice a week    Attends Religious Services: 1 to 4 times per year    Active Member of [employer] or  Organizations: No    Attends Banker Meetings: Never    Marital Status: Married  Catering manager Violence: Not At Risk (08/23/2021)   Humiliation, Afraid, Rape, and Kick questionnaire    Fear of Current or Ex-Partner: No    Emotionally Abused: No    Physically Abused: No    Sexually Abused: No   Social History   Tobacco Use  Smoking Status Never  Smokeless Tobacco Never   Social History   Substance and Sexual Activity  Alcohol Use No   Alcohol/week: 0.0 standard drinks of alcohol    Family History:  Family History  Problem Relation Age of Onset   Heart disease Mother    Thyroid disease Mother    COPD Father    Stroke Father    Glaucoma Father    Thyroid disease Sister    Arthritis Brother    Crohn's disease Brother    Down syndrome Brother    Alzheimer's disease Maternal Grandmother    Breast cancer Maternal Grandmother 58   Heart disease Maternal Grandfather    Lupus Paternal Grandmother    Diabetes Paternal Grandfather     Past medical history, surgical history, medications, allergies, family history and social history reviewed with patient today and changes made to appropriate areas of the chart.   Review of Systems - negative All other ROS negative except what is listed above and in the HPI.      Objective:    BP 135/75   Pulse 64   Temp 98 F (36.7 C) (Oral)   Ht 5' 2.01" (1.575 m)   Wt 158 lb 9.6 oz (71.9 kg)   LMP  (LMP Unknown)   SpO2 99%  BMI 29.00 kg/m   Wt Readings from Last 3 Encounters:  10/31/22 158 lb 9.6 oz (71.9 kg)  02/28/22 155 lb 4.8 oz (70.4 kg)  10/09/21 151 lb 12.8 oz (68.9 kg)    Physical Exam Vitals and nursing note reviewed.  Constitutional:      General: She is awake. She is not in acute distress.    Appearance: She is well-developed. She is not ill-appearing.  HENT:     Head: Normocephalic and atraumatic.     Right Ear: Hearing, tympanic membrane, ear canal and external ear normal. No drainage.     Left  Ear: Hearing, tympanic membrane, ear canal and external ear normal. No drainage.     Nose: Nose normal.     Right Sinus: No maxillary sinus tenderness or frontal sinus tenderness.     Left Sinus: No maxillary sinus tenderness or frontal sinus tenderness.     Mouth/Throat:     Mouth: Mucous membranes are moist.     Pharynx: Oropharynx is clear. Uvula midline. No pharyngeal swelling, oropharyngeal exudate or posterior oropharyngeal erythema.  Eyes:     General: Lids are normal.        Right eye: No discharge.        Left eye: No discharge.     Extraocular Movements: Extraocular movements intact.     Conjunctiva/sclera: Conjunctivae normal.     Pupils: Pupils are equal, round, and reactive to light.     Visual Fields: Right eye visual fields normal and left eye visual fields normal.  Neck:     Thyroid: No thyromegaly.     Vascular: No carotid bruit.     Trachea: Trachea normal.  Cardiovascular:     Rate and Rhythm: Normal rate and regular rhythm.     Heart sounds: Normal heart sounds. No murmur heard.    No gallop.  Pulmonary:     Effort: Pulmonary effort is normal. No accessory muscle usage or respiratory distress.     Breath sounds: Normal breath sounds.  Chest:  Breasts:    Right: Normal.     Left: Normal.  Abdominal:     General: Bowel sounds are normal.     Palpations: Abdomen is soft. There is no hepatomegaly or splenomegaly.     Tenderness: There is no abdominal tenderness.  Musculoskeletal:        General: Normal range of motion.     Cervical back: Normal range of motion and neck supple.     Right lower leg: No edema.     Left lower leg: No edema.  Lymphadenopathy:     Head:     Right side of head: No submental, submandibular, tonsillar, preauricular or posterior auricular adenopathy.     Left side of head: No submental, submandibular, tonsillar, preauricular or posterior auricular adenopathy.     Cervical: No cervical adenopathy.     Upper Body:     Right upper  body: No supraclavicular, axillary or pectoral adenopathy.     Left upper body: No supraclavicular, axillary or pectoral adenopathy.  Skin:    General: Skin is warm and dry.     Capillary Refill: Capillary refill takes less than 2 seconds.     Findings: No rash.  Neurological:     Mental Status: She is alert and oriented to person, place, and time.     Gait: Gait is intact.     Deep Tendon Reflexes: Reflexes are normal and symmetric.     Reflex Scores:  Brachioradialis reflexes are 2+ on the right side and 2+ on the left side.      Patellar reflexes are 2+ on the right side and 2+ on the left side. Psychiatric:        Attention and Perception: Attention normal.        Mood and Affect: Mood normal.        Speech: Speech normal.        Behavior: Behavior normal. Behavior is cooperative.        Thought Content: Thought content normal.        Judgment: Judgment normal.    Results for orders placed or performed in visit on 02/28/22  Basic metabolic panel  Result Value Ref Range   Glucose 79 70 - 99 mg/dL   BUN 16 8 - 27 mg/dL   Creatinine, Ser 1.61 0.57 - 1.00 mg/dL   eGFR 74 >09 UE/AVW/0.98   BUN/Creatinine Ratio 18 12 - 28   Sodium 142 134 - 144 mmol/L   Potassium 4.2 3.5 - 5.2 mmol/L   Chloride 101 96 - 106 mmol/L   CO2 23 20 - 29 mmol/L   Calcium 9.4 8.7 - 10.3 mg/dL  Lipid Panel w/o Chol/HDL Ratio  Result Value Ref Range   Cholesterol, Total 177 100 - 199 mg/dL   Triglycerides 119 0 - 149 mg/dL   HDL 52 >14 mg/dL   VLDL Cholesterol Cal 19 5 - 40 mg/dL   LDL Chol Calc (NIH) 782 (H) 0 - 99 mg/dL  Uric acid  Result Value Ref Range   Uric Acid 2.4 (L) 3.0 - 7.2 mg/dL      Assessment & Plan:   Problem List Items Addressed This Visit       Cardiovascular and Mediastinum   Hypertension - Primary    Chronic, stable.  BP at goal in office.  Will continue Lisinopril 5 MG daily and assess -- patient aware to check BP at home and if consistent elevation >130/80 will  return to 10 MG dosing. LABS: CBC, CMP, TSH, urine ALB.  Recommend she monitor BP daily at home and document + focus on DASH diet.  Urine ALB 17 Oct 2022.  Return in 6 months for follow-up.      Relevant Orders   CBC with Differential/Platelet   Comprehensive metabolic panel   Microalbumin, Urine Waived     Respiratory   Allergic rhinitis    Chronic, ongoing.  Stable with Flonase, continue this regimen.  Refills sent.        Digestive   GERD (gastroesophageal reflux disease)    Chronic, stable.  Recommend continued use of Pepcid as needed and keeping food journal, avoid trigger foods.  Labs today.        Endocrine   Hypothyroidism    Chronic, ongoing.  Thyroid labs obtained today.  Continue current medication regimen and adjust as needed based on labs.        Relevant Medications   levothyroxine (SYNTHROID) 50 MCG tablet   Other Relevant Orders   TSH   T4, free     Musculoskeletal and Integument   Chronic gouty arthropathy without tophi    Chronic, ongoing.  Will continue Colchicine as needed only for flares and Allopurinol 400 MG daily for prevention.  Recommend monitoring diet and low purine diet focus at home.  Uric acid level today.      Relevant Medications   allopurinol (ZYLOPRIM) 300 MG tablet   allopurinol (ZYLOPRIM) 100 MG tablet  Other Relevant Orders   Uric acid     Other   BMI 29.0-29.9,adult    BMI 29.00.  Recommended eating smaller high protein, low fat meals more frequently and exercising 30 mins a day 5 times a week with a goal of 10-15lb weight loss in the next 3 months. Patient voiced their understanding and motivation to adhere to these recommendations.       Elevated alkaline phosphatase level    Recheck alk phos and GGT today.  Consider u/s in future if continued elevations or symptoms present.  Does not have a gall bladder.      Relevant Orders   Comprehensive metabolic panel   Gamma GT   Elevated low density lipoprotein (LDL) cholesterol  level    Noted on labs, recheck levels today and discussed at length with patient.  ASCVD 4.6%.  Continue diet focus.      Relevant Orders   Comprehensive metabolic panel   Lipid Panel w/o Chol/HDL Ratio   Other Visit Diagnoses     Encounter for annual physical exam       Annual physical today with labs and health maintenance reviewed, discussed with patient.        Follow up plan: Return in about 6 months (around 05/03/2023) for HTN/HLD, GOUT, THYROID.  LABORATORY TESTING:  - Pap smear: Up To Date  IMMUNIZATIONS:   - Tdap: Tetanus vaccination status reviewed: last tetanus booster within 10 years. - Influenza: Up To Date - Pneumovax: Not applicable - Prevnar: Not applicable - HPV: Not applicable - Zostavax vaccine: she is going to inquire with insurance and would like to wait - Covid vaccines: refused  SCREENING: -Mammogram: Up to date -- due next 04/09/22 - Colonoscopy: Up to date  - Bone Density: Not applicable  -Hearing Test: Not applicable  -Spirometry: Not applicable   PATIENT COUNSELING:   Advised to take 1 mg of folate supplement per day if capable of pregnancy.   Sexuality: Discussed sexually transmitted diseases, partner selection, use of condoms, avoidance of unintended pregnancy  and contraceptive alternatives.   Advised to avoid cigarette smoking.  I discussed with the patient that most people either abstain from alcohol or drink within safe limits (<=14/week and <=4 drinks/occasion for males, <=7/weeks and <= 3 drinks/occasion for females) and that the risk for alcohol disorders and other health effects rises proportionally with the number of drinks per week and how often a drinker exceeds daily limits.  Discussed cessation/primary prevention of drug use and availability of treatment for abuse.   Diet: Encouraged to adjust caloric intake to maintain  or achieve ideal body weight, to reduce intake of dietary saturated fat and total fat, to limit sodium  intake by avoiding high sodium foods and not adding table salt, and to maintain adequate dietary potassium and calcium preferably from fresh fruits, vegetables, and low-fat dairy products.    Stressed the importance of regular exercise  Injury prevention: Discussed safety belts, safety helmets, smoke detector, smoking near bedding or upholstery.   Dental health: Discussed importance of regular tooth brushing, flossing, and dental visits.    NEXT PREVENTATIVE PHYSICAL DUE IN 1 YEAR. Return in about 6 months (around 05/03/2023) for HTN/HLD, GOUT, THYROID.

## 2022-10-31 NOTE — Assessment & Plan Note (Signed)
Chronic, stable.  BP at goal in office.  Will continue Lisinopril 5 MG daily and assess -- patient aware to check BP at home and if consistent elevation >130/80 will return to 10 MG dosing. LABS: CBC, CMP, TSH, urine ALB.  Recommend she monitor BP daily at home and document + focus on DASH diet.  Urine ALB 17 Oct 2022.  Return in 6 months for follow-up.

## 2022-11-01 LAB — URIC ACID: Uric Acid: 2.6 mg/dL — ABNORMAL LOW (ref 3.0–7.2)

## 2022-11-01 LAB — CBC WITH DIFFERENTIAL/PLATELET
Basophils Absolute: 0 10*3/uL (ref 0.0–0.2)
Basos: 1 %
EOS (ABSOLUTE): 0.1 10*3/uL (ref 0.0–0.4)
Eos: 2 %
Hematocrit: 44.3 % (ref 34.0–46.6)
Hemoglobin: 14.7 g/dL (ref 11.1–15.9)
Immature Grans (Abs): 0.1 10*3/uL (ref 0.0–0.1)
Immature Granulocytes: 1 %
Lymphocytes Absolute: 1.3 10*3/uL (ref 0.7–3.1)
Lymphs: 23 %
MCH: 29.5 pg (ref 26.6–33.0)
MCHC: 33.2 g/dL (ref 31.5–35.7)
MCV: 89 fL (ref 79–97)
Monocytes Absolute: 0.4 10*3/uL (ref 0.1–0.9)
Monocytes: 7 %
Neutrophils Absolute: 3.9 10*3/uL (ref 1.4–7.0)
Neutrophils: 66 %
Platelets: 177 10*3/uL (ref 150–450)
RBC: 4.99 x10E6/uL (ref 3.77–5.28)
RDW: 13 % (ref 11.7–15.4)
WBC: 5.8 10*3/uL (ref 3.4–10.8)

## 2022-11-01 LAB — COMPREHENSIVE METABOLIC PANEL
ALT: 18 IU/L (ref 0–32)
AST: 19 IU/L (ref 0–40)
Albumin/Globulin Ratio: 2.2 (ref 1.2–2.2)
Albumin: 4.4 g/dL (ref 3.8–4.9)
Alkaline Phosphatase: 190 IU/L — ABNORMAL HIGH (ref 44–121)
BUN/Creatinine Ratio: 15 (ref 12–28)
BUN: 14 mg/dL (ref 8–27)
Bilirubin Total: 0.6 mg/dL (ref 0.0–1.2)
CO2: 25 mmol/L (ref 20–29)
Calcium: 9.5 mg/dL (ref 8.7–10.3)
Chloride: 104 mmol/L (ref 96–106)
Creatinine, Ser: 0.94 mg/dL (ref 0.57–1.00)
Globulin, Total: 2 g/dL (ref 1.5–4.5)
Glucose: 88 mg/dL (ref 70–99)
Potassium: 4.5 mmol/L (ref 3.5–5.2)
Sodium: 141 mmol/L (ref 134–144)
Total Protein: 6.4 g/dL (ref 6.0–8.5)
eGFR: 69 mL/min/{1.73_m2} (ref >59)

## 2022-11-01 LAB — LIPID PANEL W/O CHOL/HDL RATIO
Cholesterol, Total: 173 mg/dL (ref 100–199)
HDL: 54 mg/dL (ref >39)
LDL Chol Calc (NIH): 103 mg/dL — ABNORMAL HIGH (ref 0–99)
Triglycerides: 89 mg/dL (ref 0–149)
VLDL Cholesterol Cal: 16 mg/dL (ref 5–40)

## 2022-11-01 LAB — TSH: TSH: 5.56 u[IU]/mL — ABNORMAL HIGH (ref 0.450–4.500)

## 2022-11-01 LAB — GAMMA GT: GGT: 43 IU/L (ref 0–60)

## 2022-11-01 LAB — T4, FREE: Free T4: 1.64 ng/dL (ref 0.82–1.77)

## 2022-11-02 ENCOUNTER — Other Ambulatory Visit: Payer: Self-pay | Admitting: Nurse Practitioner

## 2022-11-02 DIAGNOSIS — E039 Hypothyroidism, unspecified: Secondary | ICD-10-CM

## 2022-11-02 MED ORDER — LEVOTHYROXINE SODIUM 75 MCG PO TABS: 75.0000 ug | ORAL_TABLET | Freq: Every day | ORAL | 3 refills | Status: DC

## 2022-11-02 NOTE — Progress Notes (Signed)
Contacted via MyChart, however please call as appears doe not check MyChart + needs lab only visit in 6 weeks please:    Good morning Kristen Larson, your labs have returned: - Kidney function, creatinine and eGFR, remains normal, as is liver function, AST and ALT.  - Alkaline phosphatase remains mildly elevated, which we continue to monitor + GGT normal.  No changes needed for this. - LDL, bad cholesterol, remains elevated.  But no medication needed at this time.  Continue focus on health diet and regular activity. - CBC shows no anemia or infection. - Uric acid level remains low, which is good and should prevent flares. - TSH is a bit elevated and Free T4 normal, but on upper end normal.  I would like to increase your Levothyroxine to 75 MCG daily and stop 50 MCG daily for now.  Then we will recheck labs via outpatient lab visit in 6 weeks. I have sent in 75 MCG dosing. Any questions? Keep being awesome!!  Thank you for allowing me to participate in your care.  I appreciate you. Kindest regards, Chayton Murata

## 2022-11-04 NOTE — Progress Notes (Signed)
Attempted to reach patient to get scheduled for a lab only appointment for 6 weeks, LVM to call office back to get scheduled.  Put in CRM.

## 2022-11-26 ENCOUNTER — Other Ambulatory Visit: Payer: Self-pay | Admitting: Nurse Practitioner

## 2022-11-26 NOTE — Telephone Encounter (Signed)
Requested Prescriptions  Pending Prescriptions Disp Refills   lisinopril (ZESTRIL) 5 MG tablet [Pharmacy Med Name: LISINOPRIL 5 MG TABLET] 90 tablet 1    Sig: TAKE 1 TABLET (5 MG TOTAL) BY MOUTH DAILY.     Cardiovascular:  ACE Inhibitors Passed - 11/26/2022  2:12 PM      Passed - Cr in normal range and within 180 days    Creatinine  Date Value Ref Range Status  05/25/2014 1.03 0.60 - 1.30 mg/dL Final   Creatinine, Ser  Date Value Ref Range Status  10/31/2022 0.94 0.57 - 1.00 mg/dL Final         Passed - K in normal range and within 180 days    Potassium  Date Value Ref Range Status  10/31/2022 4.5 3.5 - 5.2 mmol/L Final  05/24/2014 3.9 3.5 - 5.1 mmol/L Final         Passed - Patient is not pregnant      Passed - Last BP in normal range    BP Readings from Last 1 Encounters:  10/31/22 135/75         Passed - Valid encounter within last 6 months    Recent Outpatient Visits           3 weeks ago Primary hypertension   [place] [facility] [place], Corrie Dandy T, NP   9 months ago Primary hypertension   [place] [facility] [place], Corrie Dandy T, NP   1 year ago Hypothyroidism, unspecified type   [place] [facility] [place], Corrie Dandy T, NP   1 year ago Primary hypertension   [place] [facility] [place], Corrie Dandy T, NP   1 year ago Skin lesions   [place] [facility] [place], Dorie Rank, NP       Future Appointments             In 5 months Cannady, Dorie Rank, NP [facility]

## 2022-12-03 ENCOUNTER — Telehealth: Payer: Self-pay | Admitting: Nurse Practitioner

## 2022-12-03 ENCOUNTER — Other Ambulatory Visit: Payer: Self-pay | Admitting: Nurse Practitioner

## 2022-12-03 MED ORDER — LEVOTHYROXINE SODIUM 75 MCG PO TABS: 75.0000 ug | ORAL_TABLET | Freq: Every day | ORAL | 3 refills | Status: DC

## 2022-12-03 NOTE — Telephone Encounter (Signed)
Left vm for patient and husband stating that all they needed was anew prescription and PCP has sent rx in

## 2022-12-03 NOTE — Telephone Encounter (Signed)
Patient's husband came into office asking to get a message to her provider Aura Dials, NP about her Levothyroxine . Stating that she is to be on that dose for 6 weeks, then return for labwork. Patient has only been on for 4 weeks, went to [facility] to refill the remaining 2 weeks and the insurance will not pay. Husband is asking if there needs to be a Prior Authorization the reason the insurance will not cover. I did inform patient's husband that Corrie Dandy is out of the office and that he may want to contact her insurance company. He said she is now completely out and wants advice on what to do. Gevena Barre [phone]. Please advise

## 2022-12-19 ENCOUNTER — Other Ambulatory Visit (INDEPENDENT_AMBULATORY_CARE_PROVIDER_SITE_OTHER): Payer: 59

## 2022-12-19 DIAGNOSIS — E039 Hypothyroidism, unspecified: Secondary | ICD-10-CM

## 2022-12-20 LAB — TSH: TSH: 3.26 u[IU]/mL (ref 0.450–4.500)

## 2022-12-20 LAB — T4, FREE: Free T4: 1.75 ng/dL (ref 0.82–1.77)

## 2022-12-20 NOTE — Progress Notes (Signed)
Contacted via MyChart -- although appears not to check so please call:  Good morning Kristen Larson, your thyroid testing has returned and is normal this check.  Continue current Levothyroxine dosing:)

## 2022-12-22 ENCOUNTER — Telehealth: Payer: Self-pay | Admitting: *Deleted

## 2022-12-22 NOTE — Telephone Encounter (Signed)
Pt given lab results per notes of J. Cannady, NP from 12/20/22 on 12/22/22. Pt verbalized understanding and to continue current levothyroxine dose.

## 2023-01-01 ENCOUNTER — Other Ambulatory Visit: Payer: Self-pay | Admitting: Nurse Practitioner

## 2023-01-01 NOTE — Telephone Encounter (Signed)
Last reordered for a 6 month fill on 11/26/22 #90 1 RF to requesting pharmacy.  Requested Prescriptions  Refused Prescriptions Disp Refills   lisinopril (ZESTRIL) 5 MG tablet [Pharmacy Med Name: LISINOPRIL 5 MG TABLET] 90 tablet 1    Sig: TAKE 1 TABLET (5 MG TOTAL) BY MOUTH DAILY. MUST GET AT [facility] OR MAIL ORDER     Cardiovascular:  ACE Inhibitors Passed - 01/01/2023  5:35 AM      Passed - Cr in normal range and within 180 days    Creatinine  Date Value Ref Range Status  05/25/2014 1.03 0.60 - 1.30 mg/dL Final   Creatinine, Ser  Date Value Ref Range Status  10/31/2022 0.94 0.57 - 1.00 mg/dL Final         Passed - K in normal range and within 180 days    Potassium  Date Value Ref Range Status  10/31/2022 4.5 3.5 - 5.2 mmol/L Final  05/24/2014 3.9 3.5 - 5.1 mmol/L Final         Passed - Patient is not pregnant      Passed - Last BP in normal range    BP Readings from Last 1 Encounters:  10/31/22 135/75         Passed - Valid encounter within last 6 months    Recent Outpatient Visits           2 months ago Primary hypertension   [place] [facility] [place], Corrie Dandy T, NP   10 months ago Primary hypertension   [place] [facility] [place], Corrie Dandy T, NP   1 year ago Hypothyroidism, unspecified type   [place] [facility] [place], Corrie Dandy T, NP   1 year ago Primary hypertension   [place] [facility] [place], Corrie Dandy T, NP   1 year ago Skin lesions   [place] [facility] [place], Dorie Rank, NP       Future Appointments             In 4 months Cannady, Dorie Rank, NP  [facility]

## 2023-01-30 ENCOUNTER — Other Ambulatory Visit: Payer: Self-pay | Admitting: Nurse Practitioner

## 2023-02-02 NOTE — Telephone Encounter (Signed)
Request is too soon, last refill 11/26/22 for 90 and 1 refill.  Requested Prescriptions  Pending Prescriptions Disp Refills   lisinopril (ZESTRIL) 5 MG tablet [Pharmacy Med Name: LISINOPRIL 5 MG TABLET] 90 tablet 1    Sig: TAKE 1 TABLET (5 MG TOTAL) BY MOUTH DAILY. MUST GET AT [facility] OR MAIL ORDER     Cardiovascular:  ACE Inhibitors Passed - 01/30/2023 12:22 PM      Passed - Cr in normal range and within 180 days    Creatinine  Date Value Ref Range Status  05/25/2014 1.03 0.60 - 1.30 mg/dL Final   Creatinine, Ser  Date Value Ref Range Status  10/31/2022 0.94 0.57 - 1.00 mg/dL Final         Passed - K in normal range and within 180 days    Potassium  Date Value Ref Range Status  10/31/2022 4.5 3.5 - 5.2 mmol/L Final  05/24/2014 3.9 3.5 - 5.1 mmol/L Final         Passed - Patient is not pregnant      Passed - Last BP in normal range    BP Readings from Last 1 Encounters:  10/31/22 135/75         Passed - Valid encounter within last 6 months    Recent Outpatient Visits           3 months ago Primary hypertension   [place] [facility] [place], Corrie Dandy T, NP   11 months ago Primary hypertension   [place] [facility] [place], Corrie Dandy T, NP   1 year ago Hypothyroidism, unspecified type   [place] [facility] [place], Corrie Dandy T, NP   1 year ago Primary hypertension   [place] [facility] [place], Corrie Dandy T, NP   1 year ago Skin lesions   [place] [facility] [place], Dorie Rank, NP       Future Appointments             In 3 months Cannady, Dorie Rank, NP [place] [facility], [facility]

## 2023-03-06 ENCOUNTER — Other Ambulatory Visit: Payer: Self-pay | Admitting: Nurse Practitioner

## 2023-03-06 NOTE — Telephone Encounter (Signed)
Rx 11/26/22 #90 1RF- too soon Requested Prescriptions  Pending Prescriptions Disp Refills   lisinopril (ZESTRIL) 5 MG tablet [Pharmacy Med Name: LISINOPRIL 5 MG TABLET] 90 tablet 1    Sig: TAKE 1 TABLET (5 MG TOTAL) BY MOUTH DAILY. MUST GET AT [facility] OR MAIL ORDER     Cardiovascular:  ACE Inhibitors Passed - 03/06/2023  2:30 PM      Passed - Cr in normal range and within 180 days    Creatinine  Date Value Ref Range Status  05/25/2014 1.03 0.60 - 1.30 mg/dL Final   Creatinine, Ser  Date Value Ref Range Status  10/31/2022 0.94 0.57 - 1.00 mg/dL Final         Passed - K in normal range and within 180 days    Potassium  Date Value Ref Range Status  10/31/2022 4.5 3.5 - 5.2 mmol/L Final  05/24/2014 3.9 3.5 - 5.1 mmol/L Final         Passed - Patient is not pregnant      Passed - Last BP in normal range    BP Readings from Last 1 Encounters:  10/31/22 135/75         Passed - Valid encounter within last 6 months    Recent Outpatient Visits           4 months ago Primary hypertension   [place] [facility] [place], Corrie Dandy T, NP   1 year ago Primary hypertension   [place] [facility] [place], Corrie Dandy T, NP   1 year ago Hypothyroidism, unspecified type   [place] [facility] [place], Dorie Rank, NP   1 year ago Primary hypertension   [place] [facility] [place], Corrie Dandy T, NP   1 year ago Skin lesions   [place] [facility] [place], Dorie Rank, NP       Future Appointments             In 2 months Cannady, Dorie Rank, NP [place] [facility], [facility]

## 2023-03-20 ENCOUNTER — Other Ambulatory Visit: Payer: Self-pay | Admitting: Nurse Practitioner

## 2023-03-20 DIAGNOSIS — N63 Unspecified lump in unspecified breast: Secondary | ICD-10-CM

## 2023-03-27 ENCOUNTER — Other Ambulatory Visit: Payer: Self-pay | Admitting: Nurse Practitioner

## 2023-03-27 NOTE — Telephone Encounter (Signed)
Requested Prescriptions  Pending Prescriptions Disp Refills   lisinopril (ZESTRIL) 5 MG tablet [Pharmacy Med Name: LISINOPRIL 5 MG TABLET] 90 tablet 1    Sig: TAKE 1 TABLET (5 MG TOTAL) BY MOUTH DAILY. MUST GET AT [facility] OR MAIL ORDER     Cardiovascular:  ACE Inhibitors Passed - 03/27/2023  5:04 PM      Passed - Cr in normal range and within 180 days    Creatinine  Date Value Ref Range Status  05/25/2014 1.03 0.60 - 1.30 mg/dL Final   Creatinine, Ser  Date Value Ref Range Status  10/31/2022 0.94 0.57 - 1.00 mg/dL Final         Passed - K in normal range and within 180 days    Potassium  Date Value Ref Range Status  10/31/2022 4.5 3.5 - 5.2 mmol/L Final  05/24/2014 3.9 3.5 - 5.1 mmol/L Final         Passed - Patient is not pregnant      Passed - Last BP in normal range    BP Readings from Last 1 Encounters:  10/31/22 135/75         Passed - Valid encounter within last 6 months    Recent Outpatient Visits           4 months ago Primary hypertension   [place] [facility] [place], Corrie Dandy T, NP   1 year ago Primary hypertension   [place] [facility] [place], Corrie Dandy T, NP   1 year ago Hypothyroidism, unspecified type   [place] [facility] [place], Dorie Rank, NP   1 year ago Primary hypertension   [place] [facility] [place], Corrie Dandy T, NP   1 year ago Skin lesions   [place] [facility] [place], Dorie Rank, NP       Future Appointments             In 1 month Cannady, Dorie Rank, NP [place] [facility], [facility]

## 2023-04-03 ENCOUNTER — Other Ambulatory Visit: Payer: Self-pay | Admitting: Nurse Practitioner

## 2023-04-03 NOTE — Telephone Encounter (Signed)
Medication Refill - Medication: levothyroxine (SYNTHROID) 75 MCG tablet  lisinopril (ZESTRIL) 5 MG tablet  Needs 3 month supply for each of these   Has the patient contacted their pharmacy? Yes.   (Agent: If no, request that the patient contact the pharmacy for the refill. If patient does not wish to contact the pharmacy document the reason why and proceed with request.) (Agent: If yes, when and what did the pharmacy advise?)  Preferred Pharmacy (with phone number or street name):  [id number] [place] [place] - [address]  [address] [place] [address]  Phone: [phone] Fax: [phone]   Has the patient been seen for an appointment in the last year OR does the patient have an upcoming appointment? Yes.    Agent: Please be advised that RX refills may take up to 3 business days. We ask that you follow-up with your pharmacy.

## 2023-04-06 MED ORDER — LEVOTHYROXINE SODIUM 75 MCG PO TABS: 75.0000 ug | ORAL_TABLET | Freq: Every day | ORAL | 0 refills | Status: DC

## 2023-04-06 NOTE — Telephone Encounter (Signed)
Requested Prescriptions  Pending Prescriptions Disp Refills   levothyroxine (SYNTHROID) 75 MCG tablet 90 tablet 0    Sig: Take 1 tablet (75 mcg total) by mouth daily.     Endocrinology:  Hypothyroid Agents Passed - 04/03/2023 10:30 AM      Passed - TSH in normal range and within 360 days    TSH  Date Value Ref Range Status  12/19/2022 3.260 0.450 - 4.500 uIU/mL Final         Passed - Valid encounter within last 12 months    Recent Outpatient Visits           5 months ago Primary hypertension   [place] [facility] [place], Corrie Dandy T, NP   1 year ago Primary hypertension   [place] [facility] [place], Corrie Dandy T, NP   1 year ago Hypothyroidism, unspecified type   [place] [facility] [place], Corrie Dandy T, NP   1 year ago Primary hypertension   [place] [facility] [place], [place] T, NP   2 years ago Skin lesions   [place] [facility] [place], [place] T, NP       Future Appointments             In 1 month [place], [place] T, NP [place] [facility]             lisinopril (ZESTRIL) 5 MG tablet 90 tablet 1    Sig: Take 1 tablet (5 mg total) by mouth daily.     Cardiovascular:  ACE Inhibitors Passed - 04/03/2023 10:30 AM      Passed - Cr in normal range and within 180 days    Creatinine  Date Value Ref Range Status  05/25/2014 1.03 0.60 - 1.30 mg/dL Final   Creatinine, Ser  Date Value Ref Range Status  10/31/2022 0.94 0.57 - 1.00 mg/dL Final         Passed - K in normal range and within 180 days    Potassium  Date Value Ref Range Status  10/31/2022 4.5 3.5 - 5.2 mmol/L Final  05/24/2014 3.9 3.5 - 5.1 mmol/L Final         Passed - Patient is not pregnant      Passed - Last BP in normal range    BP Readings from Last 1 Encounters:  10/31/22 135/75         Passed - Valid encounter within last 6 months    Recent Outpatient Visits           5 months ago Primary  hypertension   [place] [facility] [place], Corrie Dandy T, NP   1 year ago Primary hypertension   [place] [facility] [place], Corrie Dandy T, NP   1 year ago Hypothyroidism, unspecified type   [place] [facility] [place], Corrie Dandy T, NP   1 year ago Primary hypertension   [place] [facility] [place], [place] T, NP   2 years ago Skin lesions   [place] [facility] [place], Dorie Rank, NP       Future Appointments             In 1 month Cannady, Dorie Rank, NP [place] [facility], [facility]

## 2023-04-17 ENCOUNTER — Ambulatory Visit
Admission: RE | Admit: 2023-04-17 | Discharge: 2023-04-17 | Disposition: A | Discharge status: Home / Self Care | Payer: 59 | Source: Ambulatory Visit | Attending: Nurse Practitioner | Admitting: Nurse Practitioner

## 2023-04-17 DIAGNOSIS — N63 Unspecified lump in unspecified breast: Secondary | ICD-10-CM | POA: Insufficient documentation

## 2023-05-15 ENCOUNTER — Ambulatory Visit: Payer: 59 | Admitting: Nurse Practitioner

## 2023-05-15 DIAGNOSIS — Z23 Encounter for immunization: Secondary | ICD-10-CM

## 2023-05-15 DIAGNOSIS — Z6829 Body mass index (BMI) 29.0-29.9, adult: Secondary | ICD-10-CM

## 2023-05-15 DIAGNOSIS — E7800 Pure hypercholesterolemia, unspecified: Secondary | ICD-10-CM

## 2023-05-15 DIAGNOSIS — E039 Hypothyroidism, unspecified: Secondary | ICD-10-CM

## 2023-05-15 DIAGNOSIS — I10 Essential (primary) hypertension: Secondary | ICD-10-CM

## 2023-05-15 DIAGNOSIS — R748 Abnormal levels of other serum enzymes: Secondary | ICD-10-CM

## 2023-06-05 ENCOUNTER — Other Ambulatory Visit: Payer: Self-pay | Admitting: Nurse Practitioner

## 2023-06-05 NOTE — Telephone Encounter (Signed)
Medication Refill -  Most Recent Primary Care Visit:  Provider: [facility]  Department[facility]  Visit Type: LAB  Date: 12/19/2022  Medication: lisinopril (ZESTRIL) 5 MG tablet (needs a month supply)   Has the patient contacted their pharmacy? Yes (Agent: If no, request that the patient contact the pharmacy for the refill. If patient does not wish to contact the pharmacy document the reason why and proceed with request.) (Agent: If yes, when and what did the pharmacy advise?)  Is this the correct pharmacy for this prescription? Yes If no, delete pharmacy and type the correct one.  This is the patient's preferred pharmacy:   [id number] [place] [place] - [address] [place] [address] Phone: [phone] Fax: [phone]   Has the prescription been filled recently? Yes  Is the patient out of the medication? Yes  Has the patient been seen for an appointment in the last year OR does the patient have an upcoming appointment? Yes  Can we respond through MyChart? Yes  Agent: Please be advised that Rx refills may take up to 3 business days. We ask that you follow-up with your pharmacy.

## 2023-06-08 ENCOUNTER — Telehealth: Payer: Self-pay | Admitting: Nurse Practitioner

## 2023-06-08 ENCOUNTER — Other Ambulatory Visit: Payer: Self-pay | Admitting: Nurse Practitioner

## 2023-06-08 MED ORDER — LISINOPRIL 5 MG PO TABS: 5.0000 mg | ORAL_TABLET | Freq: Every day | ORAL | 4 refills | Status: AC

## 2023-06-08 NOTE — Telephone Encounter (Signed)
Copied from CRM [id number]. Topic: General - Other >> Jun 08, 2023  2:37 PM Everette C wrote: Reason for CRM: The patient has been directed by their pharmacy to contact their PCP and follow up on a previously submitted refill request of lisinopril (ZESTRIL) 5 MG tablet [[id number]]  Please contact the patient further when possible

## 2023-06-08 NOTE — Telephone Encounter (Signed)
Called and notified patient that medication has been sent in.

## 2023-06-11 NOTE — Telephone Encounter (Signed)
 Requested Prescriptions  Refused Prescriptions Disp Refills   lisinopril  (ZESTRIL ) 5 MG tablet [Pharmacy Med Name: LISINOPRIL  5 MG TABLET] 90 tablet 1    Sig: TAKE 1 TABLET (5 MG TOTAL) BY MOUTH DAILY. MUST GET AT [facility] OR MAIL ORDER     Cardiovascular:  ACE Inhibitors Failed - 06/11/2023  5:35 PM      Failed - Cr in normal range and within 180 days    Creatinine  Date Value Ref Range Status  05/25/2014 1.03 0.60 - 1.30 mg/dL Final   Creatinine, Ser  Date Value Ref Range Status  10/31/2022 0.94 0.57 - 1.00 mg/dL Final         Failed - K in normal range and within 180 days    Potassium  Date Value Ref Range Status  10/31/2022 4.5 3.5 - 5.2 mmol/L Final  05/24/2014 3.9 3.5 - 5.1 mmol/L Final         Failed - Valid encounter within last 6 months    Recent Outpatient Visits           7 months ago Primary hypertension   [place] [facility] [place], Melanie T, NP   1 year ago Primary hypertension   [place] [facility] [place], Melanie T, NP   1 year ago Hypothyroidism, unspecified type   [place] [facility] [place], Melanie DASEN, NP   1 year ago Primary hypertension   [place] [facility] [place], [place] T, NP   2 years ago Skin lesions   [place] [facility] [place], Melanie DASEN, NP       Future Appointments             In 3 weeks Cannady, Jolene T, NP [place] [facility]            Passed - Patient is not pregnant      Passed - Last BP in normal range    BP Readings from Last 1 Encounters:  10/31/22 135/75

## 2023-06-11 NOTE — Telephone Encounter (Signed)
 Requested Prescriptions  Pending Prescriptions Disp Refills   lisinopril  (ZESTRIL ) 5 MG tablet 90 tablet 1    Sig: Take 1 tablet (5 mg total) by mouth daily.     Cardiovascular:  ACE Inhibitors Failed - 06/11/2023  7:36 AM      Failed - Cr in normal range and within 180 days    Creatinine  Date Value Ref Range Status  05/25/2014 1.03 0.60 - 1.30 mg/dL Final   Creatinine, Ser  Date Value Ref Range Status  10/31/2022 0.94 0.57 - 1.00 mg/dL Final         Failed - K in normal range and within 180 days    Potassium  Date Value Ref Range Status  10/31/2022 4.5 3.5 - 5.2 mmol/L Final  05/24/2014 3.9 3.5 - 5.1 mmol/L Final         Failed - Valid encounter within last 6 months    Recent Outpatient Visits           7 months ago Primary hypertension   [place] [facility] [place], Melanie T, NP   1 year ago Primary hypertension   [place] [facility] [place], Melanie T, NP   1 year ago Hypothyroidism, unspecified type   [place] [facility] [place], Melanie DASEN, NP   1 year ago Primary hypertension   [place] [facility] [place], [place] T, NP   2 years ago Skin lesions   [place] [facility] [place], Melanie DASEN, NP       Future Appointments             In 3 weeks Cannady, Jolene T, NP [place] [facility]            Passed - Patient is not pregnant      Passed - Last BP in normal range    BP Readings from Last 1 Encounters:  10/31/22 135/75

## 2023-07-02 ENCOUNTER — Other Ambulatory Visit: Payer: Self-pay | Admitting: Nurse Practitioner

## 2023-07-02 NOTE — Telephone Encounter (Signed)
Requested Prescriptions  Pending Prescriptions Disp Refills   levothyroxine (SYNTHROID) 75 MCG tablet [Pharmacy Med Name: LEVOTHYROXINE 75 MCG TABLET] 90 tablet 0    Sig: TAKE 1 TABLET BY MOUTH EVERY DAY     Endocrinology:  Hypothyroid Agents Passed - 07/02/2023  9:35 AM      Passed - TSH in normal range and within 360 days    TSH  Date Value Ref Range Status  12/19/2022 3.260 0.450 - 4.500 uIU/mL Final         Passed - Valid encounter within last 12 months    Recent Outpatient Visits           8 months ago Primary hypertension   [place] [facility] [place], Corrie Dandy T, NP   1 year ago Primary hypertension   [place] [facility] [place], Corrie Dandy T, NP   1 year ago Hypothyroidism, unspecified type   [place] [facility] [place], Dorie Rank, NP   1 year ago Primary hypertension   [place] [facility] [place], Corrie Dandy T, NP   2 years ago Skin lesions   [place] [facility] [place], Dorie Rank, NP       Future Appointments             In 5 days Marjie Skiff, NP [place] [facility]

## 2023-07-07 ENCOUNTER — Ambulatory Visit: Payer: 59 | Admitting: Nurse Practitioner

## 2023-07-12 NOTE — Patient Instructions (Incomplete)
 Be Involved in Caring For Your Health:  Taking Medications When medications are taken as directed, they can greatly improve your health. But if they are not taken as prescribed, they may not work. In some cases, not taking them correctly can be harmful. To help ensure your treatment remains effective and safe, understand your medications and how to take them. Bring your medications to each visit for review by your provider.  Your lab results, notes, and after visit summary will be available on My Chart. We strongly encourage you to use this feature. If lab results are abnormal the clinic will contact you with the appropriate steps. If the clinic does not contact you assume the results are satisfactory. You can always view your results on My Chart. If you have questions regarding your health or results, please contact the clinic during office hours. You can also ask questions on My Chart.  We at [facility] are grateful that you chose [place] to provide your care. We strive to provide evidence-based and compassionate care and are always looking for feedback. If you get a survey from the clinic please complete this so we can hear your opinions.  Heart-Healthy Eating Plan Many factors influence your heart health, including eating and exercise habits. Heart health is also called coronary health. Coronary risk increases with abnormal blood fat (lipid) levels. A heart-healthy eating plan includes limiting unhealthy fats, increasing healthy fats, limiting salt (sodium) intake, and making other diet and lifestyle changes. What is my plan? Your health care provider may recommend that: You limit your fat intake to _________% or less of your total calories each day. You limit your saturated fat intake to _________% or less of your total calories each day. You limit the amount of cholesterol in your diet to less than _________ mg per day. You limit the amount of sodium in your diet to less than _________  mg per day. What are tips for following this plan? Cooking Cook foods using methods other than frying. Baking, boiling, grilling, and broiling are all good options. Other ways to reduce fat include: Removing the skin from poultry. Removing all visible fats from meats. Steaming vegetables in water or broth. Meal planning  At meals, imagine dividing your plate into fourths: Fill one-half of your plate with vegetables and green salads. Fill one-fourth of your plate with whole grains. Fill one-fourth of your plate with lean protein foods. Eat 2-4 cups of vegetables per day. One cup of vegetables equals 1 cup (91 g) broccoli or cauliflower florets, 2 medium carrots, 1 large bell pepper, 1 large sweet potato, 1 large tomato, 1 medium white potato, 2 cups (150 g) raw leafy greens. Eat 1-2 cups of fruit per day. One cup of fruit equals 1 small apple, 1 large banana, 1 cup (237 g) mixed fruit, 1 large orange,  cup (82 g) dried fruit, 1 cup (240 mL) 100% fruit juice. Eat more foods that contain soluble fiber. Examples include apples, broccoli, carrots, beans, peas, and barley. Aim to get 25-30 g of fiber per day. Increase your consumption of legumes, nuts, and seeds to 4-5 servings per week. One serving of dried beans or legumes equals  cup (90 g) cooked, 1 serving of nuts is  oz (12 almonds, 24 pistachios, or 7 walnut halves), and 1 serving of seeds equals  oz (8 g). Fats Choose healthy fats more often. Choose monounsaturated and polyunsaturated fats, such as olive and canola oils, avocado oil, flaxseeds, walnuts, almonds, and seeds. Eat  more omega-3 fats. Choose salmon, mackerel, sardines, tuna, flaxseed oil, and ground flaxseeds. Aim to eat fish at least 2 times each week. Check food labels carefully to identify foods with trans fats or high amounts of saturated fat. Limit saturated fats. These are found in animal products, such as meats, butter, and cream. Plant sources of saturated fats  include palm oil, palm kernel oil, and coconut oil. Avoid foods with partially hydrogenated oils in them. These contain trans fats. Examples are stick margarine, some tub margarines, cookies, crackers, and other baked goods. Avoid fried foods. General information Eat more home-cooked food and less restaurant, buffet, and fast food. Limit or avoid alcohol. Limit foods that are high in added sugar and simple starches such as foods made using white refined flour (white breads, pastries, sweets). Lose weight if you are overweight. Losing just 5-10% of your body weight can help your overall health and prevent diseases such as diabetes and heart disease. Monitor your sodium intake, especially if you have high blood pressure. Talk with your health care provider about your sodium intake. Try to incorporate more vegetarian meals weekly. What foods should I eat? Fruits All fresh, canned (in natural juice), or frozen fruits. Vegetables Fresh or frozen vegetables (raw, steamed, roasted, or grilled). Green salads. Grains Most grains. Choose whole wheat and whole grains most of the time. Rice and pasta, including brown rice and pastas made with whole wheat. Meats and other proteins Lean, well-trimmed beef, veal, pork, and lamb. Chicken and [place] without skin. All fish and shellfish. Wild duck, rabbit, pheasant, and venison. Egg whites or low-cholesterol egg substitutes. Dried beans, peas, lentils, and tofu. Seeds and most nuts. Dairy Low-fat or nonfat cheeses, including ricotta and mozzarella. Skim or 1% milk (liquid, powdered, or evaporated). Buttermilk made with low-fat milk. Nonfat or low-fat yogurt. Fats and oils Non-hydrogenated (trans-free) margarines. Vegetable oils, including soybean, sesame, sunflower, olive, avocado, peanut, safflower, corn, canola, and cottonseed. Salad dressings or mayonnaise made with a vegetable oil. Beverages Water (mineral or sparkling). Coffee and tea. Unsweetened ice  tea. Diet beverages. Sweets and desserts Sherbet, gelatin, and fruit ice. Small amounts of dark chocolate. Limit all sweets and desserts. Seasonings and condiments All seasonings and condiments. The items listed above may not be a complete list of foods and beverages you can eat. Contact a dietitian for more options. What foods should I avoid? Fruits Canned fruit in heavy syrup. Fruit in cream or butter sauce. Fried fruit. Limit coconut. Vegetables Vegetables cooked in cheese, cream, or butter sauce. Fried vegetables. Grains Breads made with saturated or trans fats, oils, or whole milk. Croissants. Sweet rolls. Donuts. High-fat crackers, such as cheese crackers and chips. Meats and other proteins Fatty meats, such as hot dogs, ribs, sausage, bacon, rib-eye roast or steak. High-fat deli meats, such as salami and bologna. Caviar. Domestic duck and goose. Organ meats, such as liver. Dairy Cream, sour cream, cream cheese, and creamed cottage cheese. Whole-milk cheeses. Whole or 2% milk (liquid, evaporated, or condensed). Whole buttermilk. Cream sauce or high-fat cheese sauce. Whole-milk yogurt. Fats and oils Meat fat, or shortening. Cocoa butter, hydrogenated oils, palm oil, coconut oil, palm kernel oil. Solid fats and shortenings, including bacon fat, salt pork, lard, and butter. Nondairy cream substitutes. Salad dressings with cheese or sour cream. Beverages Regular sodas and any drinks with added sugar. Sweets and desserts Frosting. Pudding. Cookies. Cakes. Pies. Milk chocolate or white chocolate. Buttered syrups. Full-fat ice cream or ice cream drinks. The items listed above may  not be a complete list of foods and beverages to avoid. Contact a dietitian for more information. Summary Heart-healthy meal planning includes limiting unhealthy fats, increasing healthy fats, limiting salt (sodium) intake and making other diet and lifestyle changes. Lose weight if you are overweight. Losing just  5-10% of your body weight can help your overall health and prevent diseases such as diabetes and heart disease. Focus on eating a balance of foods, including fruits and vegetables, low-fat or nonfat dairy, lean protein, nuts and legumes, whole grains, and heart-healthy oils and fats. This information is not intended to replace advice given to you by your health care provider. Make sure you discuss any questions you have with your health care provider. Document Revised: 07/01/2021 Document Reviewed: 07/01/2021 Elsevier Patient Education  2024 [employer].

## 2023-07-15 ENCOUNTER — Ambulatory Visit: Payer: Self-pay | Admitting: Nurse Practitioner

## 2023-07-15 DIAGNOSIS — Z6829 Body mass index (BMI) 29.0-29.9, adult: Secondary | ICD-10-CM

## 2023-07-15 DIAGNOSIS — E7800 Pure hypercholesterolemia, unspecified: Secondary | ICD-10-CM

## 2023-07-15 DIAGNOSIS — E039 Hypothyroidism, unspecified: Secondary | ICD-10-CM

## 2023-07-15 DIAGNOSIS — I10 Essential (primary) hypertension: Secondary | ICD-10-CM

## 2023-07-17 ENCOUNTER — Ambulatory Visit: Payer: 59 | Admitting: Nurse Practitioner

## 2023-07-17 ENCOUNTER — Encounter: Payer: Self-pay | Admitting: Nurse Practitioner

## 2023-07-17 VITALS — BP 124/74 | HR 65 | Temp 97.9°F | Ht 62.0 in | Wt 157.2 lb

## 2023-07-17 DIAGNOSIS — I10 Essential (primary) hypertension: Secondary | ICD-10-CM | POA: Diagnosis not present

## 2023-07-17 DIAGNOSIS — Z23 Encounter for immunization: Secondary | ICD-10-CM | POA: Diagnosis not present

## 2023-07-17 DIAGNOSIS — E7800 Pure hypercholesterolemia, unspecified: Secondary | ICD-10-CM

## 2023-07-17 DIAGNOSIS — Z6828 Body mass index (BMI) 28.0-28.9, adult: Secondary | ICD-10-CM

## 2023-07-17 DIAGNOSIS — M1A00X0 Idiopathic chronic gout, unspecified site, without tophus (tophi): Secondary | ICD-10-CM

## 2023-07-17 DIAGNOSIS — E039 Hypothyroidism, unspecified: Secondary | ICD-10-CM

## 2023-07-17 NOTE — Assessment & Plan Note (Signed)
BMI 28.75.  Recommended eating smaller high protein, low fat meals more frequently and exercising 30 mins a day 5 times a week with a goal of 10-15lb weight loss in the next 3 months. Patient voiced their understanding and motivation to adhere to these recommendations.

## 2023-07-17 NOTE — Assessment & Plan Note (Signed)
 Chronic, ongoing.  Thyroid  labs at physical.  Continue current medication regimen and adjust as needed based on labs.

## 2023-07-17 NOTE — Assessment & Plan Note (Signed)
 Ongoing.  Noted on labs, recheck levels at physical and discussed at length with patient.  ASCVD 4.2%.  Continue diet focus.

## 2023-07-17 NOTE — Patient Instructions (Signed)

## 2023-07-17 NOTE — Assessment & Plan Note (Signed)
 Chronic, stable.  BP at goal in office.  Will continue Lisinopril  5 MG daily and assess. Patient aware to check BP at home and if elevations  >130/80 will return to 10 MG dosing. LABS: up to date.  Recommend she monitor BP daily at home and document + focus on DASH diet.  Urine ALB 17 Oct 2022.

## 2023-07-17 NOTE — Assessment & Plan Note (Signed)
 Chronic, ongoing.  Will continue Colchicine  as needed only for flares and Allopurinol  400 MG daily for prevention.  Recommend monitoring diet and low purine diet focus at home.  Uric acid level at physical.

## 2023-07-17 NOTE — Progress Notes (Signed)
 BP 124/74   Pulse 65   Temp 97.9 F (36.6 C) (Oral)   Ht 5' 2 (1.575 m)   Wt 157 lb 3.2 oz (71.3 kg)   LMP  (LMP Unknown)   SpO2 98%   BMI 28.75 kg/m    Subjective:    Patient ID: Kristen Larson, female    DOB: 1961-02-25, 63 y.o.   MRN: [id number]  HPI: Kristen Larson is a 63 y.o. female  Chief Complaint  Patient presents with   Gout   Hyperlipidemia   Hypertension   Hypothyroidism   HYPERTENSION / HYPERLIPIDEMIA Taking Lisinopril  and continues focus on diet. Satisfied with current treatment? yes Duration of hypertension: chronic BP monitoring frequency: not checking BP range:  BP medication side effects: no Duration of hyperlipidemia: chronic Aspirin: yes Recent stressors: no Recurrent headaches: no Visual changes: no Palpitations: no Dyspnea: no Chest pain: no Lower extremity edema: no Dizzy/lightheaded: no  The 10-year ASCVD risk score (Arnett DK, et al., 2019) is: 4.2%   Values used to calculate the score:     Age: 55 years     Sex: Female     Is Non-Hispanic African American: No     Diabetic: No     Tobacco smoker: No     Systolic Blood Pressure: 124 mmHg     Is BP treated: Yes     HDL Cholesterol: 54 mg/dL     Total Cholesterol: 173 mg/dL   GOUT Taking Allopurinol  400 MG daily + Colchicine  PRN.  Indocin  as needed.  No recent flares. Duration:chronic Right 1st metatarsophalangeal pain: yes Left 1st metatarsophalangeal pain: yes Right knee pain: no Left knee pain: no Swelling: no Redness: no Trauma: no Recent dietary change or indiscretion: no Fevers: no Nausea/vomiting: no Aggravating factors: Alleviating factors:  Status:  stable Treatments attempted: Allopurinol  and Colchicine   HYPOTHYROIDISM Taking Levothyroxine  75 MCG.   Thyroid  control status: stable Satisfied with current treatment? yes Medication side effects: no Medication compliance: good compliance Etiology of hypothyroidism:  Recent dose adjustment:no Fatigue:  no Cold intolerance: no Heat intolerance: no Weight gain: no Weight loss: no Constipation: no Diarrhea/loose stools: no Palpitations: no Lower extremity edema: no Anxiety/depressed mood: no     07/17/2023   11:20 AM 10/31/2022    9:38 AM 02/28/2022    8:37 AM 10/09/2021    9:26 AM 08/23/2021    8:26 AM  Depression screen PHQ 2/9  Decreased Interest 0 0 0 0 0  Down, Depressed, Hopeless 0 0 0 0 0  PHQ - 2 Score 0 0 0 0 0  Altered sleeping 0 0 1 0 1  Tired, decreased energy 0 0 0 0 0  Change in appetite 0 0 0 0 0  Feeling bad or failure about yourself  0 0 0 0 0  Trouble concentrating 0 0 0 0 0  Moving slowly or fidgety/restless 0 0 0 0 0  Suicidal thoughts 0 0 0 0 0  PHQ-9 Score 0 0 1 0 1  Difficult doing work/chores Not difficult at all Not difficult at all Not difficult at all Not difficult at all        07/17/2023   11:20 AM 10/31/2022    9:38 AM 02/28/2022    8:37 AM 10/09/2021    9:27 AM  GAD 7 : Generalized Anxiety Score  Nervous, Anxious, on Edge 0 0 0 0  Control/stop worrying 0 0 0 0  Worry too much - different things 0 0 0 0  Trouble relaxing 0 0 0 0  Restless 0 0 0 0  Easily annoyed or irritable 0 0 0 0  Afraid - awful might happen 0 0 0 0  Total GAD 7 Score 0 0 0 0  Anxiety Difficulty Not difficult at all Not difficult at all Not difficult at all Not difficult at all   Relevant past medical, surgical, family and social history reviewed and updated as indicated. Interim medical history since our last visit reviewed. Allergies and medications reviewed and updated.  Review of Systems  Constitutional:  Negative for activity change, appetite change, diaphoresis, fatigue and fever.  Respiratory:  Negative for cough, chest tightness and shortness of breath.   Cardiovascular:  Negative for chest pain, palpitations and leg swelling.  Gastrointestinal: Negative.   Neurological: Negative.   Psychiatric/Behavioral: Negative.     Per HPI unless specifically indicated  above     Objective:    BP 124/74   Pulse 65   Temp 97.9 F (36.6 C) (Oral)   Ht 5' 2 (1.575 m)   Wt 157 lb 3.2 oz (71.3 kg)   LMP  (LMP Unknown)   SpO2 98%   BMI 28.75 kg/m   Wt Readings from Last 3 Encounters:  07/17/23 157 lb 3.2 oz (71.3 kg)  10/31/22 158 lb 9.6 oz (71.9 kg)  02/28/22 155 lb 4.8 oz (70.4 kg)    Physical Exam Vitals and nursing note reviewed.  Constitutional:      General: She is awake. She is not in acute distress.    Appearance: She is well-developed and well-groomed. She is not ill-appearing or toxic-appearing.  HENT:     Head: Normocephalic.     Right Ear: Hearing and external ear normal.     Left Ear: Hearing and external ear normal.  Eyes:     General: Lids are normal.        Right eye: No discharge.        Left eye: No discharge.     Conjunctiva/sclera: Conjunctivae normal.     Pupils: Pupils are equal, round, and reactive to light.  Neck:     Thyroid : No thyromegaly.     Vascular: No carotid bruit.  Cardiovascular:     Rate and Rhythm: Normal rate and regular rhythm.     Heart sounds: Normal heart sounds. No murmur heard.    No gallop.  Pulmonary:     Effort: Pulmonary effort is normal. No accessory muscle usage or respiratory distress.     Breath sounds: Normal breath sounds.  Abdominal:     General: Bowel sounds are normal. There is no distension.     Palpations: Abdomen is soft.     Tenderness: There is no abdominal tenderness.  Musculoskeletal:     Cervical back: Normal range of motion and neck supple.     Right lower leg: No edema.     Left lower leg: No edema.  Lymphadenopathy:     Cervical: No cervical adenopathy.  Skin:    General: Skin is warm and dry.  Neurological:     Mental Status: She is alert and oriented to person, place, and time.     Deep Tendon Reflexes: Reflexes are normal and symmetric.     Reflex Scores:      Brachioradialis reflexes are 2+ on the right side and 2+ on the left side.      Patellar reflexes  are 2+ on the right side and 2+ on the left side. Psychiatric:  Attention and Perception: Attention normal.        Mood and Affect: Mood normal.        Speech: Speech normal.        Behavior: Behavior normal. Behavior is cooperative.        Thought Content: Thought content normal.    Results for orders placed or performed in visit on 12/19/22  TSH   Collection Time: 12/19/22  8:25 AM  Result Value Ref Range   TSH 3.260 0.450 - 4.500 uIU/mL  T4, free   Collection Time: 12/19/22  8:25 AM  Result Value Ref Range   Free T4 1.75 0.82 - 1.77 ng/dL      Assessment & Plan:   Problem List Items Addressed This Visit       Cardiovascular and Mediastinum   Hypertension - Primary   Chronic, stable.  BP at goal in office.  Will continue Lisinopril  5 MG daily and assess. Patient aware to check BP at home and if elevations  >130/80 will return to 10 MG dosing. LABS: up to date.  Recommend she monitor BP daily at home and document + focus on DASH diet.  Urine ALB 17 Oct 2022.          Endocrine   Hypothyroidism   Chronic, ongoing.  Thyroid  labs at physical.  Continue current medication regimen and adjust as needed based on labs.          Musculoskeletal and Integument   Chronic gouty arthropathy without tophi   Chronic, ongoing.  Will continue Colchicine  as needed only for flares and Allopurinol  400 MG daily for prevention.  Recommend monitoring diet and low purine diet focus at home.  Uric acid level at physical.        Other   BMI 28.0-28.9,adult   BMI 28.75.  Recommended eating smaller high protein, low fat meals more frequently and exercising 30 mins a day 5 times a week with a goal of 10-15lb weight loss in the next 3 months. Patient voiced their understanding and motivation to adhere to these recommendations.       Elevated low density lipoprotein (LDL) cholesterol level   Ongoing.  Noted on labs, recheck levels at physical and discussed at length with patient.  ASCVD  4.2%.  Continue diet focus.      Other Visit Diagnoses       Need for influenza vaccination       Flu vaccine today, educated patient.   Relevant Orders   Flu vaccine trivalent PF, 6mos and older(Flulaval,Afluria,Fluarix,Fluzone) (Completed)        Follow up plan: Return in about 4 months (around 11/17/2023) for Annual Physical -- after 10/31/23.

## 2023-11-15 NOTE — Patient Instructions (Incomplete)
 Be Involved in Caring For Your Health:  Taking Medications When medications are taken as directed, they can greatly improve your health. But if they are not taken as prescribed, they may not work. In some cases, not taking them correctly can be harmful. To help ensure your treatment remains effective and safe, understand your medications and how to take them. Bring your medications to each visit for review by your provider.  Your lab results, notes, and after visit summary will be available on My Chart. We strongly encourage you to use this feature. If lab results are abnormal the clinic will contact you with the appropriate steps. If the clinic does not contact you assume the results are satisfactory. You can always view your results on My Chart. If you have questions regarding your health or results, please contact the clinic during office hours. You can also ask questions on My Chart.  We at [facility] are grateful that you chose [place] to provide your care. We strive to provide evidence-based and compassionate care and are always looking for feedback. If you get a survey from the clinic please complete this so we can hear your opinions.  Healthy Eating, Adult Healthy eating may help you get and keep a healthy body weight, reduce the risk of chronic disease, and live a long and productive life. It is important to follow a healthy eating pattern. Your nutritional and calorie needs should be met mainly by different nutrient-rich foods. What are tips for following this plan? Reading food labels Read labels and choose the following: Reduced or low sodium products. Juices with 100% fruit juice. Foods with low saturated fats (<3 g per serving) and high polyunsaturated and monounsaturated fats. Foods with whole grains, such as whole wheat, cracked wheat, brown rice, and wild rice. Whole grains that are fortified with folic acid. This is recommended for females who are pregnant or who want  to become pregnant. Read labels and do not eat or drink the following: Foods or drinks with added sugars. These include foods that contain brown sugar, corn sweetener, corn syrup, dextrose, fructose, glucose, high-fructose corn syrup, honey, invert sugar, lactose, malt syrup, maltose, molasses, raw sugar, sucrose, trehalose, or turbinado sugar. Limit your intake of added sugars to less than 10% of your total daily calories. Do not eat more than the following amounts of added sugar per day: 6 teaspoons (25 g) for females. 9 teaspoons (38 g) for males. Foods that contain processed or refined starches and grains. Refined grain products, such as white flour, degermed cornmeal, white bread, and white rice. Shopping Choose nutrient-rich snacks, such as vegetables, whole fruits, and nuts. Avoid high-calorie and high-sugar snacks, such as potato chips, fruit snacks, and candy. Use oil-based dressings and spreads on foods instead of solid fats such as butter, margarine, sour cream, or cream cheese. Limit pre-made sauces, mixes, and "instant" products such as flavored rice, instant noodles, and ready-made pasta. Try more plant-protein sources, such as tofu, tempeh, black beans, edamame, lentils, nuts, and seeds. Explore eating plans such as the Mediterranean diet or vegetarian diet. Try heart-healthy dips made with beans and healthy fats like hummus and guacamole. Vegetables go great with these. Cooking Use oil to saut or stir-fry foods instead of solid fats such as butter, margarine, or lard. Try baking, boiling, grilling, or broiling instead of frying. Remove the fatty part of meats before cooking. Steam vegetables in water or broth. Meal planning  At meals, imagine dividing your plate into fourths: One-half of  your plate is fruits and vegetables. One-fourth of your plate is whole grains. One-fourth of your plate is protein, especially lean meats, poultry, eggs, tofu, beans, or nuts. Include  low-fat dairy as part of your daily diet. Lifestyle Choose healthy options in all settings, including home, work, school, restaurants, or stores. Prepare your food safely: Wash your hands after handling raw meats. Where you prepare food, keep surfaces clean by regularly washing with hot, soapy water. Keep raw meats separate from ready-to-eat foods, such as fruits and vegetables. Cook seafood, meat, poultry, and eggs to the recommended temperature. Get a food thermometer. Store foods at safe temperatures. In general: Keep cold foods at 76F (4.4C) or below. Keep hot foods at 176F (60C) or above. Keep your freezer at [facility] (-17.8C) or below. Foods are not safe to eat if they have been between the temperatures of 40-176F (4.4-60C) for more than 2 hours. What foods should I eat? Fruits Aim to eat 1-2 cups of fresh, canned (in natural juice), or frozen fruits each day. One cup of fruit equals 1 small apple, 1 large banana, 8 large strawberries, 1 cup (237 g) canned fruit,  cup (82 g) dried fruit, or 1 cup (240 mL) 100% juice. Vegetables Aim to eat 2-4 cups of fresh and frozen vegetables each day, including different varieties and colors. One cup of vegetables equals 1 cup (91 g) broccoli or cauliflower florets, 2 medium carrots, 2 cups (150 g) raw, leafy greens, 1 large tomato, 1 large bell pepper, 1 large sweet potato, or 1 medium white potato. Grains Aim to eat 5-10 ounce-equivalents of whole grains each day. Examples of 1 ounce-equivalent of grains include 1 slice of bread, 1 cup (40 g) ready-to-eat cereal, 3 cups (24 g) popcorn, or  cup (93 g) cooked rice. Meats and other proteins Try to eat 5-7 ounce-equivalents of protein each day. Examples of 1 ounce-equivalent of protein include 1 egg,  oz nuts (12 almonds, 24 pistachios, or 7 walnut halves), 1/4 cup (90 g) cooked beans, 6 tablespoons (90 g) hummus or 1 tablespoon (16 g) peanut butter. A cut of meat or fish that is the size of a deck  of cards is about 3-4 ounce-equivalents (85 g). Of the protein you eat each week, try to have at least 8 sounce (227 g) of seafood. This is about 2 servings per week. This includes salmon, trout, herring, sardines, and anchovies. Dairy Aim to eat 3 cup-equivalents of fat-free or low-fat dairy each day. Examples of 1 cup-equivalent of dairy include 1 cup (240 mL) milk, 8 ounces (250 g) yogurt, 1 ounces (44 g) natural cheese, or 1 cup (240 mL) fortified soy milk. Fats and oils Aim for about 5 teaspoons (21 g) of fats and oils per day. Choose monounsaturated fats, such as canola and olive oils, mayonnaise made with olive oil or avocado oil, avocados, peanut butter, and most nuts, or polyunsaturated fats, such as sunflower, corn, and soybean oils, walnuts, pine nuts, sesame seeds, sunflower seeds, and flaxseed. Beverages Aim for 6 eight-ounce glasses of water per day. Limit coffee to 3-5 eight-ounce cups per day. Limit caffeinated beverages that have added calories, such as soda and energy drinks. If you drink alcohol: Limit how much you have to: 0-1 drink a day if you are female. 0-2 drinks a day if you are female. Know how much alcohol is in your drink. In the [place] one drink is one 12 oz bottle of beer (355 mL), one 5 oz glass of wine (  148 mL), or one 1 oz glass of hard liquor (44 mL). Seasoning and other foods Try not to add too much salt to your food. Try using herbs and spices instead of salt. Try not to add sugar to food. This information is based on [place] nutrition guidelines. To learn more, visit [employer]posableN[employer].be. Exact amounts may vary. You may need different amounts. This information is not intended to replace advice given to you by your health care provider. Make sure you discuss any questions you have with your health care provider. Document Revised: 02/24/2022 Document Reviewed: 02/24/2022 Elsevier Patient Education  2024 [employer].

## 2023-11-18 ENCOUNTER — Other Ambulatory Visit: Payer: Self-pay | Admitting: Nurse Practitioner

## 2023-11-19 NOTE — Telephone Encounter (Signed)
 Requested medications are due for refill today.  yes  Requested medications are on the active medications list.  yes  Last refill. 10/31/2022 #90 4 r  Future visit scheduled.   no  Notes to clinic.  Labs are expired.    Requested Prescriptions  Pending Prescriptions Disp Refills   allopurinol  (ZYLOPRIM ) 100 MG tablet [Pharmacy Med Name: ALLOPURINOL  100 MG TABLET] 30 tablet 14    Sig: TAKE 1 TABLET (100 MG TOTAL) BY MOUTH DAILY. TAKE WITH 300 MG TABLET TO = TOTAL 400 MG DAILY.     Endocrinology:  Gout Agents - allopurinol  Failed - 11/19/2023 11:54 AM      Failed - Uric Acid in normal range and within 360 days    Uric Acid  Date Value Ref Range Status  10/31/2022 2.6 (L) 3.0 - 7.2 mg/dL Final    Comment:               Therapeutic target for gout patients: <6.0         Failed - Cr in normal range and within 360 days    Creatinine  Date Value Ref Range Status  05/25/2014 1.03 0.60 - 1.30 mg/dL Final   Creatinine, Ser  Date Value Ref Range Status  10/31/2022 0.94 0.57 - 1.00 mg/dL Final         Failed - CBC within normal limits and completed in the last 12 months    WBC  Date Value Ref Range Status  10/31/2022 5.8 3.4 - 10.8 x10E3/uL Final   RBC  Date Value Ref Range Status  10/31/2022 4.99 3.77 - 5.28 x10E6/uL Final   Hemoglobin  Date Value Ref Range Status  10/31/2022 14.7 11.1 - 15.9 g/dL Final   Hematocrit  Date Value Ref Range Status  10/31/2022 44.3 34.0 - 46.6 % Final   MCHC  Date Value Ref Range Status  10/31/2022 33.2 31.5 - 35.7 g/dL Final   [facility]  Date Value Ref Range Status  10/31/2022 29.5 26.6 - 33.0 pg Final   MCV  Date Value Ref Range Status  10/31/2022 89 79 - 97 fL Final   No results found for: PLTCOUNTKUC, LABPLAT, POCPLA RDW  Date Value Ref Range Status  10/31/2022 13.0 11.7 - 15.4 % Final         Passed - Valid encounter within last 12 months    Recent Outpatient Visits           4 months ago Primary hypertension   [facility] [place], Dupont T, NP              Signed Prescriptions Disp Refills   fluticasone  (FLONASE ) 50 MCG/ACT nasal spray 48 mL 1    Sig: SPRAY 2 SPRAYS INTO EACH NOSTRIL EVERY DAY     Ear, Nose, and Throat: Nasal Preparations - Corticosteroids Passed - 11/19/2023 11:54 AM      Passed - Valid encounter within last 12 months    Recent Outpatient Visits           4 months ago Primary hypertension   [place] [facility] [place], Lavelle Posey, NP

## 2023-11-19 NOTE — Telephone Encounter (Signed)
 Requested Prescriptions  Pending Prescriptions Disp Refills   allopurinol  (ZYLOPRIM ) 100 MG tablet [Pharmacy Med Name: ALLOPURINOL  100 MG TABLET] 30 tablet 14    Sig: TAKE 1 TABLET (100 MG TOTAL) BY MOUTH DAILY. TAKE WITH 300 MG TABLET TO = TOTAL 400 MG DAILY.     Endocrinology:  Gout Agents - allopurinol  Failed - 11/19/2023 11:54 AM      Failed - Uric Acid in normal range and within 360 days    Uric Acid  Date Value Ref Range Status  10/31/2022 2.6 (L) 3.0 - 7.2 mg/dL Final    Comment:               Therapeutic target for gout patients: <6.0         Failed - Cr in normal range and within 360 days    Creatinine  Date Value Ref Range Status  05/25/2014 1.03 0.60 - 1.30 mg/dL Final   Creatinine, Ser  Date Value Ref Range Status  10/31/2022 0.94 0.57 - 1.00 mg/dL Final         Failed - CBC within normal limits and completed in the last 12 months    WBC  Date Value Ref Range Status  10/31/2022 5.8 3.4 - 10.8 x10E3/uL Final   RBC  Date Value Ref Range Status  10/31/2022 4.99 3.77 - 5.28 x10E6/uL Final   Hemoglobin  Date Value Ref Range Status  10/31/2022 14.7 11.1 - 15.9 g/dL Final   Hematocrit  Date Value Ref Range Status  10/31/2022 44.3 34.0 - 46.6 % Final   MCHC  Date Value Ref Range Status  10/31/2022 33.2 31.5 - 35.7 g/dL Final   [facility]  Date Value Ref Range Status  10/31/2022 29.5 26.6 - 33.0 pg Final   MCV  Date Value Ref Range Status  10/31/2022 89 79 - 97 fL Final   No results found for: PLTCOUNTKUC, LABPLAT, POCPLA RDW  Date Value Ref Range Status  10/31/2022 13.0 11.7 - 15.4 % Final         Passed - Valid encounter within last 12 months    Recent Outpatient Visits           4 months ago Primary hypertension   [place] [facility] [place], Jolene T, NP               fluticasone  (FLONASE ) 50 MCG/ACT nasal spray [Pharmacy Med Name: FLUTICASONE  PROP 50 MCG SPRAY] 48 mL 1    Sig: SPRAY 2 SPRAYS INTO EACH NOSTRIL EVERY DAY      Ear, Nose, and Throat: Nasal Preparations - Corticosteroids Passed - 11/19/2023 11:54 AM      Passed - Valid encounter within last 12 months    Recent Outpatient Visits           4 months ago Primary hypertension   [facility] [place], Lavelle Posey, NP

## 2023-11-20 ENCOUNTER — Encounter: Payer: 59 | Admitting: Nurse Practitioner

## 2023-11-20 DIAGNOSIS — R748 Abnormal levels of other serum enzymes: Secondary | ICD-10-CM

## 2023-11-20 DIAGNOSIS — M1A00X0 Idiopathic chronic gout, unspecified site, without tophus (tophi): Secondary | ICD-10-CM

## 2023-11-20 DIAGNOSIS — E039 Hypothyroidism, unspecified: Secondary | ICD-10-CM

## 2023-11-20 DIAGNOSIS — J301 Allergic rhinitis due to pollen: Secondary | ICD-10-CM

## 2023-11-20 DIAGNOSIS — I10 Essential (primary) hypertension: Secondary | ICD-10-CM

## 2023-11-20 DIAGNOSIS — Z0000 Encounter for general adult medical examination without abnormal findings: Secondary | ICD-10-CM

## 2023-11-20 DIAGNOSIS — E7800 Pure hypercholesterolemia, unspecified: Secondary | ICD-10-CM

## 2023-11-20 DIAGNOSIS — Z6828 Body mass index (BMI) 28.0-28.9, adult: Secondary | ICD-10-CM

## 2023-11-23 ENCOUNTER — Other Ambulatory Visit: Payer: Self-pay | Admitting: Nurse Practitioner

## 2023-11-24 ENCOUNTER — Other Ambulatory Visit: Payer: Self-pay | Admitting: Nurse Practitioner

## 2023-11-24 NOTE — Telephone Encounter (Signed)
 Requested Prescriptions  Pending Prescriptions Disp Refills   levothyroxine  (SYNTHROID ) 75 MCG tablet [Pharmacy Med Name: LEVOTHYROXINE  75 MCG TABLET] 90 tablet 1    Sig: TAKE 1 TABLET BY MOUTH EVERY DAY     Endocrinology:  Hypothyroid Agents Passed - 11/24/2023  5:52 PM      Passed - TSH in normal range and within 360 days    TSH  Date Value Ref Range Status  12/19/2022 3.260 0.450 - 4.500 uIU/mL Final         Passed - Valid encounter within last 12 months    Recent Outpatient Visits           4 months ago Primary hypertension   [place] [facility] [place], Lavelle Posey, NP

## 2023-11-26 NOTE — Telephone Encounter (Signed)
 Requested medications are due for refill today.  yes  Requested medications are on the active medications list.  yes  Last refill. 10/31/2022 #90 4 rf  Future visit scheduled.   no  Notes to clinic.  Labs are expired.    Requested Prescriptions  Pending Prescriptions Disp Refills   allopurinol  (ZYLOPRIM ) 300 MG tablet [Pharmacy Med Name: ALLOPURINOL  300 MG TABLET] 30 tablet 14    Sig: TAKE 1 TABLET BY MOUTH EVERY DAY     Endocrinology:  Gout Agents - allopurinol  Failed - 11/26/2023 10:27 AM      Failed - Uric Acid in normal range and within 360 days    Uric Acid  Date Value Ref Range Status  10/31/2022 2.6 (L) 3.0 - 7.2 mg/dL Final    Comment:               Therapeutic target for gout patients: <6.0         Failed - Cr in normal range and within 360 days    Creatinine  Date Value Ref Range Status  05/25/2014 1.03 0.60 - 1.30 mg/dL Final   Creatinine, Ser  Date Value Ref Range Status  10/31/2022 0.94 0.57 - 1.00 mg/dL Final         Failed - CBC within normal limits and completed in the last 12 months    WBC  Date Value Ref Range Status  10/31/2022 5.8 3.4 - 10.8 x10E3/uL Final   RBC  Date Value Ref Range Status  10/31/2022 4.99 3.77 - 5.28 x10E6/uL Final   Hemoglobin  Date Value Ref Range Status  10/31/2022 14.7 11.1 - 15.9 g/dL Final   Hematocrit  Date Value Ref Range Status  10/31/2022 44.3 34.0 - 46.6 % Final   MCHC  Date Value Ref Range Status  10/31/2022 33.2 31.5 - 35.7 g/dL Final   [facility]  Date Value Ref Range Status  10/31/2022 29.5 26.6 - 33.0 pg Final   MCV  Date Value Ref Range Status  10/31/2022 89 79 - 97 fL Final   No results found for: PLTCOUNTKUC, LABPLAT, POCPLA RDW  Date Value Ref Range Status  10/31/2022 13.0 11.7 - 15.4 % Final         Passed - Valid encounter within last 12 months    Recent Outpatient Visits           4 months ago Primary hypertension   [facility] [place], Kristen Posey, NP

## 2024-03-12 NOTE — Patient Instructions (Signed)
 Be Involved in Caring For Your Health:  Taking Medications When medications are taken as directed, they can greatly improve your health. But if they are not taken as prescribed, they may not work. In some cases, not taking them correctly can be harmful. To help ensure your treatment remains effective and safe, understand your medications and how to take them. Bring your medications to each visit for review by your provider.  Your lab results, notes, and after visit summary will be available on My Chart. We strongly encourage you to use this feature. If lab results are abnormal the clinic will contact you with the appropriate steps. If the clinic does not contact you assume the results are satisfactory. You can always view your results on My Chart. If you have questions regarding your health or results, please contact the clinic during office hours. You can also ask questions on My Chart.  We at [facility] are grateful that you chose us  to provide your care. We strive to provide evidence-based and compassionate care and are always looking for feedback. If you get a survey from the clinic please complete this so we can hear your opinions.  Healthy Eating, Adult Healthy eating may help you get and keep a healthy body weight, reduce the risk of chronic disease, and live a long and productive life. It is important to follow a healthy eating pattern. Your nutritional and calorie needs should be met mainly by different nutrient-rich foods. What are tips for following this plan? Reading food labels Read labels and choose the following: Reduced or low sodium products. Juices with 100% fruit juice. Foods with low saturated fats (<3 g per serving) and high polyunsaturated and monounsaturated fats. Foods with whole grains, such as whole wheat, cracked wheat, brown rice, and wild rice. Whole grains that are fortified with folic acid. This is recommended for females who are pregnant or who want to  become pregnant. Read labels and do not eat or drink the following: Foods or drinks with added sugars. These include foods that contain brown sugar, corn sweetener, corn syrup, dextrose , fructose, glucose, high-fructose corn syrup, honey, invert sugar, lactose, malt syrup, maltose, molasses, raw sugar, sucrose, trehalose, or turbinado sugar. Limit your intake of added sugars to less than 10% of your total daily calories. Do not eat more than the following amounts of added sugar per day: 6 teaspoons (25 g) for females. 9 teaspoons (38 g) for males. Foods that contain processed or refined starches and grains. Refined grain products, such as white flour, degermed cornmeal, white bread, and white rice. Shopping Choose nutrient-rich snacks, such as vegetables, whole fruits, and nuts. Avoid high-calorie and high-sugar snacks, such as potato chips, fruit snacks, and candy. Use oil-based dressings and spreads on foods instead of solid fats such as butter, margarine, sour cream, or cream cheese. Limit pre-made sauces, mixes, and instant products such as flavored rice, instant noodles, and ready-made pasta. Try more plant-protein sources, such as tofu, tempeh, black beans, edamame, lentils, nuts, and seeds. Explore eating plans such as the Mediterranean diet or vegetarian diet. Try heart-healthy dips made with beans and healthy fats like hummus and guacamole. Vegetables go great with these. Cooking Use oil to saut or stir-fry foods instead of solid fats such as butter, margarine, or lard. Try baking, boiling, grilling, or broiling instead of frying. Remove the fatty part of meats before cooking. Steam vegetables in water  or broth. Meal planning  At meals, imagine dividing your plate into fourths: One-half of  your plate is fruits and vegetables. One-fourth of your plate is whole grains. One-fourth of your plate is protein, especially lean meats, poultry, eggs, tofu, beans, or nuts. Include low-fat  dairy as part of your daily diet. Lifestyle Choose healthy options in all settings, including home, work, school, restaurants, or stores. Prepare your food safely: Wash your hands after handling raw meats. Where you prepare food, keep surfaces clean by regularly washing with hot, soapy water . Keep raw meats separate from ready-to-eat foods, such as fruits and vegetables. Cook seafood, meat, poultry, and eggs to the recommended temperature. Get a food thermometer. Store foods at safe temperatures. In general: Keep cold foods at 84F (4.4C) or below. Keep hot foods at 184F (60C) or above. Keep your freezer at [facility] (-17.8C) or below. Foods are not safe to eat if they have been between the temperatures of 40-184F (4.4-60C) for more than 2 hours. What foods should I eat? Fruits Aim to eat 1-2 cups of fresh, canned (in natural juice), or frozen fruits each day. One cup of fruit equals 1 small apple, 1 large banana, 8 large strawberries, 1 cup (237 g) canned fruit,  cup (82 g) dried fruit, or 1 cup (240 mL) 100% juice. Vegetables Aim to eat 2-4 cups of fresh and frozen vegetables each day, including different varieties and colors. One cup of vegetables equals 1 cup (91 g) broccoli or cauliflower florets, 2 medium carrots, 2 cups (150 g) raw, leafy greens, 1 large tomato, 1 large bell pepper, 1 large sweet potato, or 1 medium white potato. Grains Aim to eat 5-10 ounce-equivalents of whole grains each day. Examples of 1 ounce-equivalent of grains include 1 slice of bread, 1 cup (40 g) ready-to-eat cereal, 3 cups (24 g) popcorn, or  cup (93 g) cooked rice. Meats and other proteins Try to eat 5-7 ounce-equivalents of protein each day. Examples of 1 ounce-equivalent of protein include 1 egg,  oz nuts (12 almonds, 24 pistachios, or 7 walnut halves), 1/4 cup (90 g) cooked beans, 6 tablespoons (90 g) hummus or 1 tablespoon (16 g) peanut butter. A cut of meat or fish that is the size of a deck of  cards is about 3-4 ounce-equivalents (85 g). Of the protein you eat each week, try to have at least 8 sounce (227 g) of seafood. This is about 2 servings per week. This includes salmon, trout, herring, sardines, and anchovies. Dairy Aim to eat 3 cup-equivalents of fat-free or low-fat dairy each day. Examples of 1 cup-equivalent of dairy include 1 cup (240 mL) milk, 8 ounces (250 g) yogurt, 1 ounces (44 g) natural cheese, or 1 cup (240 mL) fortified soy milk. Fats and oils Aim for about 5 teaspoons (21 g) of fats and oils per day. Choose monounsaturated fats, such as canola and olive oils, mayonnaise made with olive oil or avocado oil, avocados, peanut butter, and most nuts, or polyunsaturated fats, such as sunflower, corn, and soybean oils, walnuts, pine nuts, sesame seeds, sunflower seeds, and flaxseed. Beverages Aim for 6 eight-ounce glasses of water  per day. Limit coffee to 3-5 eight-ounce cups per day. Limit caffeinated beverages that have added calories, such as soda and energy drinks. If you drink alcohol: Limit how much you have to: 0-1 drink a day if you are female. 0-2 drinks a day if you are female. Know how much alcohol is in your drink. In the [place] one drink is one 12 oz bottle of beer (355 mL), one 5 oz glass of wine (  148 mL), or one 1 oz glass of hard liquor (44 mL). Seasoning and other foods Try not to add too much salt to your food. Try using herbs and spices instead of salt. Try not to add sugar to food. This information is based on [place] nutrition guidelines. To learn more, visit [employer]. Exact amounts may vary. You may need different amounts. This information is not intended to replace advice given to you by your health care provider. Make sure you discuss any questions you have with your health care provider. Document Revised: 02/24/2022 Document Reviewed: 02/24/2022 Elsevier Patient Education  2024 [employer].

## 2024-03-17 ENCOUNTER — Encounter: Payer: Self-pay | Admitting: Nurse Practitioner

## 2024-03-17 ENCOUNTER — Ambulatory Visit (INDEPENDENT_AMBULATORY_CARE_PROVIDER_SITE_OTHER): Admitting: Nurse Practitioner

## 2024-03-17 VITALS — BP 132/80 | HR 83 | Temp 98.2°F | Resp 15 | Ht 62.01 in | Wt 148.6 lb

## 2024-03-17 DIAGNOSIS — Z0000 Encounter for general adult medical examination without abnormal findings: Secondary | ICD-10-CM | POA: Diagnosis not present

## 2024-03-17 DIAGNOSIS — J301 Allergic rhinitis due to pollen: Secondary | ICD-10-CM

## 2024-03-17 DIAGNOSIS — I10 Essential (primary) hypertension: Secondary | ICD-10-CM | POA: Diagnosis not present

## 2024-03-17 DIAGNOSIS — E039 Hypothyroidism, unspecified: Secondary | ICD-10-CM | POA: Diagnosis not present

## 2024-03-17 DIAGNOSIS — K219 Gastro-esophageal reflux disease without esophagitis: Secondary | ICD-10-CM

## 2024-03-17 DIAGNOSIS — R748 Abnormal levels of other serum enzymes: Secondary | ICD-10-CM

## 2024-03-17 DIAGNOSIS — M1A00X0 Idiopathic chronic gout, unspecified site, without tophus (tophi): Secondary | ICD-10-CM

## 2024-03-17 DIAGNOSIS — E7800 Pure hypercholesterolemia, unspecified: Secondary | ICD-10-CM | POA: Diagnosis not present

## 2024-03-17 DIAGNOSIS — Z23 Encounter for immunization: Secondary | ICD-10-CM

## 2024-03-17 DIAGNOSIS — Z6828 Body mass index (BMI) 28.0-28.9, adult: Secondary | ICD-10-CM

## 2024-03-17 LAB — MICROALBUMIN, URINE WAIVED
Creatinine, Urine Waived: 200 mg/dL (ref 10–300)
Microalb, Ur Waived: 30 mg/L — ABNORMAL HIGH (ref 0–19)
Microalb/Creat Ratio: <30 mg/g (ref <30)

## 2024-03-17 MED ORDER — LEVOTHYROXINE SODIUM 75 MCG PO TABS: 75.0000 ug | ORAL_TABLET | Freq: Every day | ORAL | 3 refills | Status: AC

## 2024-03-17 MED ORDER — FLUTICASONE PROPIONATE 50 MCG/ACT NA SUSP: NASAL | 3 refills | Status: AC

## 2024-03-17 MED ORDER — COLCHICINE 0.6 MG PO TABS: ORAL_TABLET | ORAL | 3 refills | Status: AC

## 2024-03-17 NOTE — Assessment & Plan Note (Signed)
 BMI 27.17.  Recommended eating smaller high protein, low fat meals more frequently and exercising 30 mins a day 5 times a week with a goal of 10-15lb weight loss in the next 3 months. Patient voiced their understanding and motivation to adhere to these recommendations.

## 2024-03-17 NOTE — Assessment & Plan Note (Signed)
 Ongoing.  Noted on labs, recheck levels today and discussed at length with patient.  ASCVD 5.3%.  Continue diet focus.

## 2024-03-17 NOTE — Progress Notes (Signed)
 BP 132/80 (BP Location: Left Arm, Patient Position: Sitting, Cuff Size: Normal)   Pulse 83   Temp 98.2 F (36.8 C) (Oral)   Resp 15   Ht 5' 2.01 (1.575 m)   Wt 148 lb 9.6 oz (67.4 kg)   LMP  (LMP Unknown)   SpO2 98%   BMI 27.17 kg/m    Subjective:    Patient ID: Kristen Larson, female    DOB: 29-Dec-1960, 63 y.o.   MRN: [id number]  HPI: Kristen Larson is a 63 y.o. female presenting on 03/17/2024 for comprehensive medical examination. Current medical complaints include: none  She currently lives with: significant other Menopausal Symptoms: no   Continues Flonase  for allergies, which offers benefit. No other medications.  HYPERTENSION  Taking Lisinopril .  Satisfied with current treatment? yes Duration of hypertension: chronic BP monitoring frequency: not checking BP range:  BP medication side effects: no Aspirin: yes Recent stressors: no Recurrent headaches: no Visual changes: no Palpitations: no Dyspnea: no Chest pain: no Lower extremity edema: no Dizzy/lightheaded: no  The 10-year ASCVD risk score (Arnett DK, et al., 2019) is: 5.3%   Values used to calculate the score:     Age: 48 years     Clincally relevant sex: Female     Is Non-Hispanic African American: No     Diabetic: No     Tobacco smoker: No     Systolic Blood Pressure: 132 mmHg     Is BP treated: Yes     HDL Cholesterol: 54 mg/dL     Total Cholesterol: 173 mg/dL   HYPOTHYROIDISM Taking Levothyroxine  75 MCG. Thyroid  control status:stable Satisfied with current treatment? yes Medication side effects: no Medication compliance: good compliance Etiology of hypothyroidism:  Recent dose adjustment:no Fatigue: no Cold intolerance: no Heat intolerance: no Weight gain: no Weight loss: no Constipation: no Diarrhea/loose stools: occasional Palpitations: no Lower extremity edema: no Anxiety/depressed mood: no   GERD Takes a rare Pepcid.  History of elevation in Alk Phos on past labs.  Had gall  bladder removed years ago.  GERD control status: stable Satisfied with current treatment? no Heartburn frequency: once or twice a week Medication side effects: no  Previous GERD medications: none Antacid use frequency:  none Nature: reflux Location: epigastric Heartburn duration: years Alleviatiating factors:  nothing Aggravating factors: certain foods Dysphagia: no Odynophagia:  no Hematemesis: no Blood in stool: no EGD: no   GOUT Continues to Allopurinol  400 MG daily.  No recent gout flares, last flare was over a year ago. Saw rheumatology in August 2022, to return as needed only. Duration: chronic Swelling: no Redness: no Trauma: no Recent dietary change or indiscretion: no Fevers: no Nausea/vomiting: no Aggravating factors: foods Alleviating factors: Allopurinol  Status:  stable Treatments attempted: as above  Depression Screen done today and results listed below:     03/17/2024    9:03 AM 07/17/2023   11:20 AM 10/31/2022    9:38 AM 02/28/2022    8:37 AM 10/09/2021    9:26 AM  Depression screen PHQ 2/9  Decreased Interest 0 0 0 0 0  Down, Depressed, Hopeless 0 0 0 0 0  PHQ - 2 Score 0 0 0 0 0  Altered sleeping 0 0 0 1 0  Tired, decreased energy 0 0 0 0 0  Change in appetite 0 0 0 0 0  Feeling bad or failure about yourself  0 0 0 0 0  Trouble concentrating 0 0 0 0 0  Moving slowly or  fidgety/restless 0 0 0 0 0  Suicidal thoughts 0 0 0 0 0  PHQ-9 Score 0 0 0 1 0  Difficult doing work/chores  Not difficult at all Not difficult at all Not difficult at all Not difficult at all      03/17/2024    9:03 AM 07/17/2023   11:20 AM 10/31/2022    9:38 AM 02/28/2022    8:37 AM  GAD 7 : Generalized Anxiety Score  Nervous, Anxious, on Edge 0 0 0 0  Control/stop worrying 0 0 0 0  Worry too much - different things 0 0 0 0  Trouble relaxing 0 0 0 0  Restless 0 0 0 0  Easily annoyed or irritable 0 0 0 0  Afraid - awful might happen 0 0 0 0  Total GAD 7 Score 0 0 0 0  Anxiety  Difficulty  Not difficult at all Not difficult at all Not difficult at all      08/23/2021    8:37 AM 10/09/2021    9:24 AM 10/31/2022    9:36 AM 07/17/2023   11:19 AM 03/17/2024    9:03 AM  Fall Risk  Falls in the past year? 0 0 0 0 0  Was there an injury with Fall? 0 0 0 0 0  Fall Risk Category Calculator 0 0 0 0 0  Fall Risk Category (Retired) Low  Low      (RETIRED) Patient Fall Risk Level Low fall risk  Low fall risk      Patient at Risk for Falls Due to No Fall Risks No Fall Risks  No Fall Risks No Fall Risks  Fall risk Follow up Education provided  Falls evaluation completed   Falls evaluation completed Falls evaluation completed     Data saved with a previous flowsheet row definition    Functional Status Survey: Is the patient deaf or have difficulty hearing?: No Does the patient have difficulty seeing, even when wearing glasses/contacts?: No Does the patient have difficulty concentrating, remembering, or making decisions?: No Does the patient have difficulty walking or climbing stairs?: No Does the patient have difficulty dressing or bathing?: No Does the patient have difficulty doing errands alone such as visiting a doctor's office or shopping?: No    Past Medical History:  Past Medical History:  Diagnosis Date   Allergy    GERD (gastroesophageal reflux disease)    Gout    Hypertension    Thyroid  disease     Surgical History:  Past Surgical History:  Procedure Laterality Date   CHOLECYSTECTOMY  05/25/14   TONSILLECTOMY AND ADENOIDECTOMY      Medications:  Current Outpatient Medications on File Prior to Visit  Medication Sig   allopurinol  (ZYLOPRIM ) 100 MG tablet TAKE 1 TABLET (100 MG TOTAL) BY MOUTH DAILY. TAKE WITH 300 MG TABLET TO = TOTAL 400 MG DAILY.   allopurinol  (ZYLOPRIM ) 300 MG tablet TAKE 1 TABLET BY MOUTH EVERY DAY   aspirin 81 MG tablet Take 81 mg by mouth daily.   lisinopril  (ZESTRIL ) 5 MG tablet Take 1 tablet (5 mg total) by mouth daily.   Multiple  Vitamin (MULTIVITAMIN) tablet Take 1 tablet by mouth daily.   No current facility-administered medications on file prior to visit.    Allergies:  Allergies  Allergen Reactions   Penicillins Rash    Social History:  Social History   Socioeconomic History   Marital status: Married    Spouse name: Not on file   Number of  children: Not on file   Years of education: Not on file   Highest education level: Not on file  Occupational History   Not on file  Tobacco Use   Smoking status: Never   Smokeless tobacco: Never  Vaping Use   Vaping status: Never Used  Substance and Sexual Activity   Alcohol use: No    Alcohol/week: 0.0 standard drinks of alcohol   Drug use: No   Sexual activity: Yes  Other Topics Concern   Not on file  Social History Narrative   Not on file   Social Drivers of Health   Financial Resource Strain: Low Risk  (03/17/2024)   Overall Financial Resource Strain (CARDIA)    Difficulty of Paying Living Expenses: Not hard at all  Food Insecurity: No Food Insecurity (03/17/2024)   Hunger Vital Sign    Worried About Running Out of Food in the Last Year: Never true    Ran Out of Food in the Last Year: Never true  Transportation Needs: No Transportation Needs (03/17/2024)   PRAPARE - Administrator, Civil Service (Medical): No    Lack of Transportation (Non-Medical): No  Physical Activity: Inactive (03/17/2024)   Exercise Vital Sign    Days of Exercise per Week: 0 days    Minutes of Exercise per Session: 0 min  Stress: No Stress Concern Present (03/17/2024)   [employer] of Occupational Health - Occupational Stress Questionnaire    Feeling of Stress: Not at all  Social Connections: Moderately Integrated (03/17/2024)   Social Connection and Isolation Panel    Frequency of Communication with Friends and Family: Twice a week    Frequency of Social Gatherings with Friends and Family: Twice a week    Attends Religious Services: 1 to 4 times per year     Active Member of [employer] or Organizations: No    Attends Banker Meetings: Never    Marital Status: Married  Catering manager Violence: Not At Risk (03/17/2024)   Humiliation, Afraid, Rape, and Kick questionnaire    Fear of Current or Ex-Partner: No    Emotionally Abused: No    Physically Abused: No    Sexually Abused: No   Social History   Tobacco Use  Smoking Status Never  Smokeless Tobacco Never   Social History   Substance and Sexual Activity  Alcohol Use No   Alcohol/week: 0.0 standard drinks of alcohol    Family History:  Family History  Problem Relation Age of Onset   Heart disease Mother    Thyroid  disease Mother    COPD Father    Stroke Father    Glaucoma Father    Thyroid  disease Sister    Arthritis Brother    Crohn's disease Brother    Down syndrome Brother    Alzheimer's disease Maternal Grandmother    Breast cancer Maternal Grandmother 58   Heart disease Maternal Grandfather    Lupus Paternal Grandmother    Diabetes Paternal Grandfather     Past medical history, surgical history, medications, allergies, family history and social history reviewed with patient today and changes made to appropriate areas of the chart.   Review of Systems - negative All other ROS negative except what is listed above and in the HPI.      Objective:    BP 132/80 (BP Location: Left Arm, Patient Position: Sitting, Cuff Size: Normal)   Pulse 83   Temp 98.2 F (36.8 C) (Oral)   Resp 15   Ht  5' 2.01 (1.575 m)   Wt 148 lb 9.6 oz (67.4 kg)   LMP  (LMP Unknown)   SpO2 98%   BMI 27.17 kg/m   Wt Readings from Last 3 Encounters:  03/17/24 148 lb 9.6 oz (67.4 kg)  07/17/23 157 lb 3.2 oz (71.3 kg)  10/31/22 158 lb 9.6 oz (71.9 kg)    Physical Exam Vitals and nursing note reviewed. Exam conducted with a chaperone present.  Constitutional:      General: She is awake. She is not in acute distress.    Appearance: She is well-developed and well-groomed. She is  not ill-appearing or toxic-appearing.  HENT:     Head: Normocephalic and atraumatic.     Right Ear: Hearing, tympanic membrane, ear canal and external ear normal. No drainage.     Left Ear: Hearing, tympanic membrane, ear canal and external ear normal. No drainage.     Nose: Nose normal.     Right Sinus: No maxillary sinus tenderness or frontal sinus tenderness.     Left Sinus: No maxillary sinus tenderness or frontal sinus tenderness.     Mouth/Throat:     Mouth: Mucous membranes are moist.     Pharynx: Oropharynx is clear. Uvula midline. No pharyngeal swelling, oropharyngeal exudate or posterior oropharyngeal erythema.  Eyes:     General: Lids are normal.        Right eye: No discharge.        Left eye: No discharge.     Extraocular Movements: Extraocular movements intact.     Conjunctiva/sclera: Conjunctivae normal.     Pupils: Pupils are equal, round, and reactive to light.     Visual Fields: Right eye visual fields normal and left eye visual fields normal.  Neck:     Thyroid : No thyromegaly.     Vascular: No carotid bruit.     Trachea: Trachea normal.  Cardiovascular:     Rate and Rhythm: Normal rate and regular rhythm.     Heart sounds: Normal heart sounds. No murmur heard.    No gallop.  Pulmonary:     Effort: Pulmonary effort is normal. No accessory muscle usage or respiratory distress.     Breath sounds: Normal breath sounds.  Chest:  Breasts:    Right: Normal.     Left: Normal.  Abdominal:     General: Bowel sounds are normal.     Palpations: Abdomen is soft. There is no hepatomegaly or splenomegaly.     Tenderness: There is no abdominal tenderness.  Musculoskeletal:        General: Normal range of motion.     Cervical back: Normal range of motion and neck supple.     Right lower leg: No edema.     Left lower leg: No edema.  Lymphadenopathy:     Head:     Right side of head: No submental, submandibular, tonsillar, preauricular or posterior auricular adenopathy.      Left side of head: No submental, submandibular, tonsillar, preauricular or posterior auricular adenopathy.     Cervical: No cervical adenopathy.     Upper Body:     Right upper body: No supraclavicular, axillary or pectoral adenopathy.     Left upper body: No supraclavicular, axillary or pectoral adenopathy.  Skin:    General: Skin is warm and dry.     Capillary Refill: Capillary refill takes less than 2 seconds.     Findings: No rash.  Neurological:     Mental Status: She is alert  and oriented to person, place, and time.     Gait: Gait is intact.     Deep Tendon Reflexes: Reflexes are normal and symmetric.     Reflex Scores:      Brachioradialis reflexes are 2+ on the right side and 2+ on the left side.      Patellar reflexes are 2+ on the right side and 2+ on the left side. Psychiatric:        Attention and Perception: Attention normal.        Mood and Affect: Mood normal.        Speech: Speech normal.        Behavior: Behavior normal. Behavior is cooperative.        Thought Content: Thought content normal.        Judgment: Judgment normal.    Results for orders placed or performed in visit on 03/17/24  Microalbumin, Urine Waived   Collection Time: 03/17/24  9:12 AM  Result Value Ref Range   Microalb, Ur Waived 30 (H) 0 - 19 mg/L   Creatinine, Urine Waived 200 10 - 300 mg/dL   Microalb/Creat Ratio <30 <30 mg/g      Assessment & Plan:   Problem List Items Addressed This Visit       Cardiovascular and Mediastinum   Hypertension - Primary   Chronic, stable.  BP at goal in office on recheck.  Will continue Lisinopril  5 MG daily and assess. Patient aware to check BP at home and if elevations  >130/80 will return to 10 MG dosing. LABS: CBC, CMP, TSH.  Recommend she monitor BP daily at home and document + focus on DASH diet.  Urine ALB 07 April 2024.        Relevant Orders   Microalbumin, Urine Waived (Completed)   CBC with Differential/Platelet     Respiratory    Allergic rhinitis   Chronic, ongoing.  Stable with Flonase , continue this regimen.  Refills sent.        Digestive   GERD (gastroesophageal reflux disease)   Chronic, stable.  Recommend continued use of Pepcid as needed and keeping food journal, avoid trigger foods.  Labs today.        Endocrine   Hypothyroidism   Chronic, ongoing.  Thyroid  labs today.  Continue current medication regimen and adjust as needed based on labs.        Relevant Medications   levothyroxine  (SYNTHROID ) 75 MCG tablet   Other Relevant Orders   TSH   T4, free     Musculoskeletal and Integument   Chronic gouty arthropathy without tophi   Chronic, ongoing.  Will continue Colchicine  as needed only for flares and Allopurinol  400 MG daily for prevention.  Recommend monitoring diet and low purine diet focus at home.  Uric acid level today.      Relevant Medications   colchicine  0.6 MG tablet   Other Relevant Orders   Uric acid     Other   Elevated low density lipoprotein (LDL) cholesterol level   Ongoing.  Noted on labs, recheck levels today and discussed at length with patient.  ASCVD 5.3%.  Continue diet focus.      Relevant Orders   Comprehensive metabolic panel with GFR   Lipid Panel w/o Chol/HDL Ratio   Elevated alkaline phosphatase level   Recheck alk phos and GGT today.  Consider u/s in future if continued elevations or symptoms present.  Does not have a gall bladder.      Relevant  Orders   Comprehensive metabolic panel with GFR   Gamma GT   BMI 28.0-28.9,adult   BMI 27.17.  Recommended eating smaller high protein, low fat meals more frequently and exercising 30 mins a day 5 times a week with a goal of 10-15lb weight loss in the next 3 months. Patient voiced their understanding and motivation to adhere to these recommendations.       Other Visit Diagnoses       Encounter for annual physical exam       Annual physical today with labs and health maintenance reviewed, discussed with  patient.        Follow up plan: Return in about 6 months (around 09/15/2024) for HTN/HLD, Hypothyroid, Gout.  LABORATORY TESTING:  - Pap smear: Up To Date  IMMUNIZATIONS:   - Tdap: Tetanus vaccination status reviewed: last tetanus booster within 10 years. - Influenza: Up To Date -- will get later in year - Pneumovax: Not applicable - Prevnar: Will think about this - HPV: Not applicable - Zostavax vaccine: Will think about this - Covid vaccines: refused  SCREENING: -Mammogram: Up to date - due in November - Colonoscopy: Up to date due 09/27/24 - Bone Density: Not applicable  -Hearing Test: Not applicable  -Spirometry: Not applicable   PATIENT COUNSELING:   Advised to take 1 mg of folate supplement per day if capable of pregnancy.   Sexuality: Discussed sexually transmitted diseases, partner selection, use of condoms, avoidance of unintended pregnancy  and contraceptive alternatives.   Advised to avoid cigarette smoking.  I discussed with the patient that most people either abstain from alcohol or drink within safe limits (<=14/week and <=4 drinks/occasion for males, <=7/weeks and <= 3 drinks/occasion for females) and that the risk for alcohol disorders and other health effects rises proportionally with the number of drinks per week and how often a drinker exceeds daily limits.  Discussed cessation/primary prevention of drug use and availability of treatment for abuse.   Diet: Encouraged to adjust caloric intake to maintain  or achieve ideal body weight, to reduce intake of dietary saturated fat and total fat, to limit sodium intake by avoiding high sodium foods and not adding table salt, and to maintain adequate dietary potassium and calcium preferably from fresh fruits, vegetables, and low-fat dairy products.    Stressed the importance of regular exercise  Injury prevention: Discussed safety belts, safety helmets, smoke detector, smoking near bedding or upholstery.   Dental  health: Discussed importance of regular tooth brushing, flossing, and dental visits.    NEXT PREVENTATIVE PHYSICAL DUE IN 1 YEAR. Return in about 6 months (around 09/15/2024) for HTN/HLD, Hypothyroid, Gout.

## 2024-03-17 NOTE — Assessment & Plan Note (Signed)
Chronic, ongoing.  Will continue Colchicine as needed only for flares and Allopurinol 400 MG daily for prevention.  Recommend monitoring diet and low purine diet focus at home.  Uric acid level today. 

## 2024-03-17 NOTE — Assessment & Plan Note (Signed)
Chronic, stable.  Recommend continued use of Pepcid as needed and keeping food journal, avoid trigger foods.  Labs today.

## 2024-03-17 NOTE — Assessment & Plan Note (Signed)
Chronic, ongoing.  Stable with Flonase, continue this regimen.  Refills sent. ?

## 2024-03-17 NOTE — Assessment & Plan Note (Signed)
Chronic, ongoing.  Thyroid labs today.  Continue current medication regimen and adjust as needed based on labs.   ?

## 2024-03-17 NOTE — Assessment & Plan Note (Signed)
 Chronic, stable.  BP at goal in office on recheck.  Will continue Lisinopril  5 MG daily and assess. Patient aware to check BP at home and if elevations  >130/80 will return to 10 MG dosing. LABS: CBC, CMP, TSH.  Recommend she monitor BP daily at home and document + focus on DASH diet.  Urine ALB 07 April 2024.

## 2024-03-17 NOTE — Assessment & Plan Note (Signed)
Recheck alk phos and GGT today.  Consider u/s in future if continued elevations or symptoms present.  Does not have a gall bladder.

## 2024-03-18 ENCOUNTER — Ambulatory Visit: Payer: Self-pay | Admitting: Nurse Practitioner

## 2024-03-18 LAB — LIPID PANEL W/O CHOL/HDL RATIO
Cholesterol, Total: 179 mg/dL (ref 100–199)
HDL: 51 mg/dL (ref >39)
LDL Chol Calc (NIH): 107 mg/dL — ABNORMAL HIGH (ref 0–99)
Triglycerides: 120 mg/dL (ref 0–149)
VLDL Cholesterol Cal: 21 mg/dL (ref 5–40)

## 2024-03-18 LAB — CBC WITH DIFFERENTIAL/PLATELET
Basophils Absolute: 0 x10E3/uL (ref 0.0–0.2)
Basos: 1 %
EOS (ABSOLUTE): 0 x10E3/uL (ref 0.0–0.4)
Eos: 1 %
Hematocrit: 47.1 % — ABNORMAL HIGH (ref 34.0–46.6)
Hemoglobin: 15.6 g/dL (ref 11.1–15.9)
Immature Grans (Abs): 0 x10E3/uL (ref 0.0–0.1)
Immature Granulocytes: 0 %
Lymphocytes Absolute: 1.3 x10E3/uL (ref 0.7–3.1)
Lymphs: 20 %
MCH: 29.6 pg (ref 26.6–33.0)
MCHC: 33.1 g/dL (ref 31.5–35.7)
MCV: 89 fL (ref 79–97)
Monocytes Absolute: 0.4 x10E3/uL (ref 0.1–0.9)
Monocytes: 6 %
Neutrophils Absolute: 4.7 x10E3/uL (ref 1.4–7.0)
Neutrophils: 71 %
Platelets: 178 x10E3/uL (ref 150–450)
RBC: 5.27 x10E6/uL (ref 3.77–5.28)
RDW: 13.1 % (ref 11.7–15.4)
WBC: 6.5 x10E3/uL (ref 3.4–10.8)

## 2024-03-18 LAB — COMPREHENSIVE METABOLIC PANEL WITH GFR
ALT: 35 IU/L — ABNORMAL HIGH (ref 0–32)
AST: 35 IU/L (ref 0–40)
Albumin: 4.5 g/dL (ref 3.9–4.9)
Alkaline Phosphatase: 174 IU/L — ABNORMAL HIGH (ref 49–135)
BUN/Creatinine Ratio: 15 (ref 12–28)
BUN: 14 mg/dL (ref 8–27)
Bilirubin Total: 1 mg/dL (ref 0.0–1.2)
CO2: 24 mmol/L (ref 20–29)
Calcium: 9.5 mg/dL (ref 8.7–10.3)
Chloride: 101 mmol/L (ref 96–106)
Creatinine, Ser: 0.95 mg/dL (ref 0.57–1.00)
Globulin, Total: 2.7 g/dL (ref 1.5–4.5)
Glucose: 87 mg/dL (ref 70–99)
Potassium: 4.1 mmol/L (ref 3.5–5.2)
Sodium: 140 mmol/L (ref 134–144)
Total Protein: 7.2 g/dL (ref 6.0–8.5)
eGFR: 68 mL/min/1.73 (ref >59)

## 2024-03-18 LAB — URIC ACID: Uric Acid: 2.5 mg/dL — ABNORMAL LOW (ref 3.0–7.2)

## 2024-03-18 LAB — GAMMA GT: GGT: 30 IU/L (ref 0–60)

## 2024-03-18 LAB — TSH: TSH: 0.933 u[IU]/mL (ref 0.450–4.500)

## 2024-03-18 LAB — T4, FREE: Free T4: 1.88 ng/dL — ABNORMAL HIGH (ref 0.82–1.77)

## 2024-03-22 ENCOUNTER — Other Ambulatory Visit: Payer: Self-pay | Admitting: Nurse Practitioner

## 2024-03-22 DIAGNOSIS — Z1231 Encounter for screening mammogram for malignant neoplasm of breast: Secondary | ICD-10-CM

## 2024-04-25 ENCOUNTER — Ambulatory Visit
Admission: RE | Admit: 2024-04-25 | Discharge: 2024-04-25 | Disposition: A | Discharge status: Home / Self Care | Source: Ambulatory Visit | Attending: Nurse Practitioner | Admitting: Nurse Practitioner

## 2024-04-25 DIAGNOSIS — Z1231 Encounter for screening mammogram for malignant neoplasm of breast: Secondary | ICD-10-CM | POA: Diagnosis present

## 2024-04-26 ENCOUNTER — Ambulatory Visit: Payer: Self-pay | Admitting: Nurse Practitioner

## 2024-04-26 NOTE — Progress Notes (Signed)
 Contacted via MyChart   Normal mammogram, may repeat in one year:)

## 2024-09-16 ENCOUNTER — Ambulatory Visit: Admitting: Nurse Practitioner
